# Patient Record
Sex: Female | Born: 1975 | Race: White | Hispanic: No | Marital: Married | Smoking: Former smoker
Health system: Southern US, Community
[De-identification: ages and names within clinical notes are randomized; demographics above are authoritative.]

## PROBLEM LIST (undated history)

## (undated) DIAGNOSIS — T7840XA Allergy, unspecified, initial encounter: Secondary | ICD-10-CM

## (undated) DIAGNOSIS — K5792 Diverticulitis of intestine, part unspecified, without perforation or abscess without bleeding: Secondary | ICD-10-CM

## (undated) DIAGNOSIS — G43909 Migraine, unspecified, not intractable, without status migrainosus: Secondary | ICD-10-CM

## (undated) DIAGNOSIS — U071 COVID-19: Secondary | ICD-10-CM

## (undated) DIAGNOSIS — M1990 Unspecified osteoarthritis, unspecified site: Secondary | ICD-10-CM

## (undated) DIAGNOSIS — F329 Major depressive disorder, single episode, unspecified: Secondary | ICD-10-CM

## (undated) DIAGNOSIS — F32A Depression, unspecified: Secondary | ICD-10-CM

## (undated) HISTORY — DX: Depression, unspecified: F32.A

## (undated) HISTORY — DX: Diverticulitis of intestine, part unspecified, without perforation or abscess without bleeding: K57.92

## (undated) HISTORY — DX: COVID-19: U07.1

## (undated) HISTORY — DX: Allergy, unspecified, initial encounter: T78.40XA

## (undated) HISTORY — DX: Major depressive disorder, single episode, unspecified: F32.9

## (undated) HISTORY — DX: Migraine, unspecified, not intractable, without status migrainosus: G43.909

## (undated) HISTORY — PX: TUBAL LIGATION: SHX77

---

## 2007-09-13 ENCOUNTER — Ambulatory Visit: Payer: Self-pay | Admitting: Family Medicine

## 2009-05-22 ENCOUNTER — Ambulatory Visit: Payer: Self-pay | Admitting: Internal Medicine

## 2012-01-14 ENCOUNTER — Emergency Department: Payer: Self-pay | Admitting: Internal Medicine

## 2016-08-27 DIAGNOSIS — M25561 Pain in right knee: Secondary | ICD-10-CM | POA: Diagnosis not present

## 2016-08-27 DIAGNOSIS — M1711 Unilateral primary osteoarthritis, right knee: Secondary | ICD-10-CM | POA: Diagnosis not present

## 2016-08-27 DIAGNOSIS — M25461 Effusion, right knee: Secondary | ICD-10-CM | POA: Diagnosis not present

## 2016-09-15 DIAGNOSIS — M1711 Unilateral primary osteoarthritis, right knee: Secondary | ICD-10-CM | POA: Diagnosis not present

## 2016-09-15 DIAGNOSIS — M25561 Pain in right knee: Secondary | ICD-10-CM | POA: Diagnosis not present

## 2016-09-15 DIAGNOSIS — M25461 Effusion, right knee: Secondary | ICD-10-CM | POA: Diagnosis not present

## 2016-09-16 DIAGNOSIS — M1711 Unilateral primary osteoarthritis, right knee: Secondary | ICD-10-CM | POA: Diagnosis not present

## 2016-10-07 DIAGNOSIS — M25561 Pain in right knee: Secondary | ICD-10-CM | POA: Diagnosis not present

## 2016-10-13 DIAGNOSIS — M25561 Pain in right knee: Secondary | ICD-10-CM | POA: Diagnosis not present

## 2016-10-16 DIAGNOSIS — M25561 Pain in right knee: Secondary | ICD-10-CM | POA: Diagnosis not present

## 2016-10-22 DIAGNOSIS — M25561 Pain in right knee: Secondary | ICD-10-CM | POA: Diagnosis not present

## 2016-10-29 DIAGNOSIS — M25561 Pain in right knee: Secondary | ICD-10-CM | POA: Diagnosis not present

## 2016-10-31 DIAGNOSIS — M25561 Pain in right knee: Secondary | ICD-10-CM | POA: Diagnosis not present

## 2016-11-03 DIAGNOSIS — M25561 Pain in right knee: Secondary | ICD-10-CM | POA: Diagnosis not present

## 2016-11-05 DIAGNOSIS — M25561 Pain in right knee: Secondary | ICD-10-CM | POA: Diagnosis not present

## 2016-11-10 DIAGNOSIS — M25561 Pain in right knee: Secondary | ICD-10-CM | POA: Diagnosis not present

## 2016-11-12 DIAGNOSIS — M25561 Pain in right knee: Secondary | ICD-10-CM | POA: Diagnosis not present

## 2016-11-17 DIAGNOSIS — M25561 Pain in right knee: Secondary | ICD-10-CM | POA: Diagnosis not present

## 2016-11-21 DIAGNOSIS — M25561 Pain in right knee: Secondary | ICD-10-CM | POA: Diagnosis not present

## 2016-11-24 DIAGNOSIS — M25561 Pain in right knee: Secondary | ICD-10-CM | POA: Diagnosis not present

## 2016-11-26 DIAGNOSIS — M25561 Pain in right knee: Secondary | ICD-10-CM | POA: Diagnosis not present

## 2016-12-01 DIAGNOSIS — M25561 Pain in right knee: Secondary | ICD-10-CM | POA: Diagnosis not present

## 2016-12-03 DIAGNOSIS — M25561 Pain in right knee: Secondary | ICD-10-CM | POA: Diagnosis not present

## 2016-12-08 DIAGNOSIS — M25561 Pain in right knee: Secondary | ICD-10-CM | POA: Diagnosis not present

## 2016-12-10 DIAGNOSIS — M25561 Pain in right knee: Secondary | ICD-10-CM | POA: Diagnosis not present

## 2016-12-17 DIAGNOSIS — M25561 Pain in right knee: Secondary | ICD-10-CM | POA: Diagnosis not present

## 2016-12-23 DIAGNOSIS — M25561 Pain in right knee: Secondary | ICD-10-CM | POA: Diagnosis not present

## 2017-07-20 ENCOUNTER — Other Ambulatory Visit: Payer: Self-pay

## 2017-07-20 ENCOUNTER — Emergency Department
Admission: EM | Admit: 2017-07-20 | Discharge: 2017-07-21 | Disposition: A | Discharge status: Home / Self Care | Payer: Commercial Managed Care - HMO | Attending: Emergency Medicine | Admitting: Emergency Medicine

## 2017-07-20 ENCOUNTER — Emergency Department: Payer: Commercial Managed Care - HMO

## 2017-07-20 DIAGNOSIS — K5732 Diverticulitis of large intestine without perforation or abscess without bleeding: Secondary | ICD-10-CM | POA: Insufficient documentation

## 2017-07-20 DIAGNOSIS — R1032 Left lower quadrant pain: Secondary | ICD-10-CM

## 2017-07-20 HISTORY — DX: Unspecified osteoarthritis, unspecified site: M19.90

## 2017-07-20 LAB — COMPREHENSIVE METABOLIC PANEL
ALBUMIN: 4.2 g/dL (ref 3.5–5.0)
ALT: 17 U/L (ref 14–54)
AST: 22 U/L (ref 15–41)
Alkaline Phosphatase: 77 U/L (ref 38–126)
Anion gap: 11 (ref 5–15)
BUN: 13 mg/dL (ref 6–20)
CHLORIDE: 102 mmol/L (ref 101–111)
CO2: 25 mmol/L (ref 22–32)
Calcium: 9.4 mg/dL (ref 8.9–10.3)
Creatinine, Ser: 0.85 mg/dL (ref 0.44–1.00)
GFR calc Af Amer: >60 mL/min (ref >60)
GFR calc non Af Amer: >60 mL/min (ref >60)
GLUCOSE: 96 mg/dL (ref 65–99)
POTASSIUM: 4.1 mmol/L (ref 3.5–5.1)
Sodium: 138 mmol/L (ref 135–145)
Total Bilirubin: 0.4 mg/dL (ref 0.3–1.2)
Total Protein: 8.1 g/dL (ref 6.5–8.1)

## 2017-07-20 LAB — URINALYSIS, COMPLETE (UACMP) WITH MICROSCOPIC
BACTERIA UA: NONE SEEN
BILIRUBIN URINE: NEGATIVE
Glucose, UA: NEGATIVE mg/dL
Hgb urine dipstick: NEGATIVE
Ketones, ur: NEGATIVE mg/dL
LEUKOCYTES UA: NEGATIVE
Nitrite: NEGATIVE
Protein, ur: NEGATIVE mg/dL
SPECIFIC GRAVITY, URINE: 1.006 (ref 1.005–1.030)
pH: 6 (ref 5.0–8.0)

## 2017-07-20 LAB — CBC
HEMATOCRIT: 38.4 % (ref 35.0–47.0)
Hemoglobin: 12.8 g/dL (ref 12.0–16.0)
MCH: 27.3 pg (ref 26.0–34.0)
MCHC: 33.4 g/dL (ref 32.0–36.0)
MCV: 81.6 fL (ref 80.0–100.0)
Platelets: 369 10*3/uL (ref 150–440)
RBC: 4.7 MIL/uL (ref 3.80–5.20)
RDW: 14.2 % (ref 11.5–14.5)
WBC: 9.3 10*3/uL (ref 3.6–11.0)

## 2017-07-20 LAB — LIPASE, BLOOD: LIPASE: 29 U/L (ref 11–51)

## 2017-07-20 LAB — PREGNANCY, URINE: PREG TEST UR: NEGATIVE

## 2017-07-20 MED ORDER — ONDANSETRON 4 MG PO TBDP: 4.0000 mg | ORAL_TABLET | Freq: Three times a day (TID) | ORAL | 0 refills | Status: DC | PRN

## 2017-07-20 MED ORDER — METRONIDAZOLE 500 MG PO TABS: 500.0000 mg | ORAL_TABLET | Freq: Three times a day (TID) | ORAL | 0 refills | Status: DC

## 2017-07-20 MED ORDER — CIPROFLOXACIN HCL 500 MG PO TABS: 500.0000 mg | ORAL_TABLET | Freq: Two times a day (BID) | ORAL | 0 refills | Status: DC

## 2017-07-20 NOTE — ED Notes (Signed)
LLQ pain since Christmas reports dull most of the time at times stabbing pain tender to touch. Pt talks in complete sentences no distress noted

## 2017-07-20 NOTE — ED Provider Notes (Signed)
[facility] Emergency Department Provider Note  ____________________________________________  Time seen: Approximately 11:34 PM  I have reviewed the triage vital signs and the nursing notes.   HISTORY  Chief Complaint Abdominal Pain    HPI Peggy Rogers is a 42 y.o. female who complains of left lower quadrant abdominal pain which started after she had the flu in the few days preceding Christmas. As all her other flulike symptoms resolve, she had persistent left lower quadrant abdominal pain, worse with certain movements, tender to the touch. Also feels like there is fullness in that area when she stretches. Denies any trauma. Pain is worse with crouching motion when getting in and out of a car. No alleviating factors. Nonradiating. Moderate intensity, dull.     Past Medical History:  Diagnosis Date  . Arthritis      There are no active problems to display for this patient.    Past Surgical History:  Procedure Laterality Date  . TUBAL LIGATION       Prior to Admission medications   Medication Sig Start Date End Date Taking? Authorizing Provider  ciprofloxacin (CIPRO) 500 MG tablet Take 1 tablet (500 mg total) by mouth 2 (two) times daily. 07/20/17   Sharman CheekStafford, Ashlyne Olenick, MD  metroNIDAZOLE (FLAGYL) 500 MG tablet Take 1 tablet (500 mg total) by mouth 3 (three) times daily. 07/20/17   Sharman CheekStafford, Glenroy Crossen, MD  ondansetron (ZOFRAN ODT) 4 MG disintegrating tablet Take 1 tablet (4 mg total) by mouth every 8 (eight) hours as needed for nausea or vomiting. 07/20/17   Sharman CheekStafford, Celese Banner, MD  None   Allergies Penicillins   No family history on file.  Social History Social History   Tobacco Use  . Smoking status: Not on file  Substance Use Topics  . Alcohol use: Not on file  . Drug use: Not on file  No alcohol or drug use  Review of Systems  Constitutional:   No fever or chills.  ENT:   No sore throat. No rhinorrhea. Cardiovascular:   No chest  pain or syncope. Respiratory:   No dyspnea or cough. Gastrointestinal:   Positive as above for abdominal pain without recent vomiting and diarrhea.  Musculoskeletal:   Negative for focal pain or swelling All other systems reviewed and are negative except as documented above in ROS and HPI.  ____________________________________________   PHYSICAL EXAM:  VITAL SIGNS: ED Triage Vitals [07/20/17 1936]  Enc Vitals Group     BP (!) 151/96     Pulse Rate 86     Resp 17     Temp 98.1 F (36.7 C)     Temp Source Oral     SpO2 100 %     Weight 231 lb (104.8 kg)     Height 5' 3.5" (1.613 m)     Head Circumference      Peak Flow      Pain Score 3     Pain Loc      Pain Edu?      Excl. in GC?     Vital signs reviewed, nursing assessments reviewed.   Constitutional:   Alert and oriented. Well appearing and in no distress. Eyes:   No scleral icterus.  EOMI. No nystagmus. No conjunctival pallor. PERRL. ENT   Head:   Normocephalic and atraumatic.   Nose:   No congestion/rhinnorhea.    Mouth/Throat:   MMM, no pharyngeal erythema. No peritonsillar mass.    Neck:   No meningismus. Full ROM. Hematological/Lymphatic/Immunilogical:  No cervical lymphadenopathy. Cardiovascular:   RRR. Symmetric bilateral radial and DP pulses.  No murmurs.  Respiratory:   Normal respiratory effort without tachypnea/retractions. Breath sounds are clear and equal bilaterally. No wheezes/rales/rhonchi. Gastrointestinal:   Soft with left lower quadrant tenderness and slight fullness to palpation.. Non distended. There is no CVA tenderness.  No rebound, rigidity, or guarding. Genitourinary:   deferred Musculoskeletal:   Normal range of motion in all extremities. No joint effusions.  No lower extremity tenderness.  No edema. Neurologic:   Normal speech and language.  Motor grossly intact. No acute focal neurologic deficits are appreciated.  Skin:    Skin is warm, dry and intact. No rash noted.  No  petechiae, purpura, or bullae.  ____________________________________________    LABS (pertinent positives/negatives) (all labs ordered are listed, but only abnormal results are displayed) Labs Reviewed  URINALYSIS, COMPLETE (UACMP) WITH MICROSCOPIC - Abnormal; Notable for the following components:      Result Value   Color, Urine YELLOW (*)    APPearance CLEAR (*)    Squamous Epithelial / LPF 0-5 (*)    All other components within normal limits  LIPASE, BLOOD  COMPREHENSIVE METABOLIC PANEL  CBC  PREGNANCY, URINE   ____________________________________________   EKG    ____________________________________________    RADIOLOGY  Ct Renal Stone Study  Result Date: 07/20/2017 CLINICAL DATA:  42 year old female with left lower quadrant abdominal pain. EXAM: CT ABDOMEN AND PELVIS WITHOUT CONTRAST TECHNIQUE: Multidetector CT imaging of the abdomen and pelvis was performed following the standard protocol without IV contrast. COMPARISON:  Abdominal radiograph dated 09/13/2007. FINDINGS: Evaluation of this exam is limited in the absence of intravenous contrast. Lower chest: The visualized lung bases are clear. No intra-abdominal free air or free fluid. Hepatobiliary: No focal liver abnormality is seen. No gallstones, gallbladder wall thickening, or biliary dilatation. Pancreas: Unremarkable. No pancreatic ductal dilatation or surrounding inflammatory changes. Spleen: Normal in size without focal abnormality. Adrenals/Urinary Tract: Adrenal glands are unremarkable. Kidneys are normal, without renal calculi, focal lesion, or hydronephrosis. Bladder is unremarkable. Stomach/Bowel: There are scattered sigmoid diverticula. There is mild haziness and inflammatory changes surrounding a sigmoid diverticula in the left hemipelvis (series 2, image 56) consistent with acute diverticulitis. No diverticular abscess or perforation. There is no bowel obstruction. Normal appendix. Vascular/Lymphatic: The  abdominal aorta and IVC are grossly unremarkable on this noncontrast CT. No portal venous gas there is no adenopathy. Reproductive: The uterus is anteverted and grossly unremarkable. The ovaries appear unremarkable as well. Bilateral tubal ligation clips noted. Other: None Musculoskeletal: Degenerative changes of the lower thoracic spine. Disc desiccation at L5-S1. No acute osseous pathology. IMPRESSION: 1. Mild sigmoid diverticulitis. No diverticular abscess or perforation. 2. No hydronephrosis or nephrolithiasis. Electronically Signed   By: Elgie Collard M.D.   On: 07/20/2017 23:47    ____________________________________________   PROCEDURES Procedures  ____________________________________________  DIFFERENTIAL DIAGNOSIS Pelvic mass, diverticulitis, ovarian cyst  CLINICAL IMPRESSION / ASSESSMENT AND PLAN / ED COURSE  Pertinent labs & imaging results that were available during my care of the patient were reviewed by me and considered in my medical decision making (see chart for details).   Patient well-appearing no acute distress, unremarkable vital signs, presents with persistent left lower quadrant pain for the past 2 or more weeks. Tender on exam. Other vague symptoms as well necessitating imaging today to screen for malignancy versus infectious process.       ----------------------------------------- 11:53 PM on 07/20/2017 -----------------------------------------  CT reveals sigmoid diverticulitis. Given the  chronicity of symptoms, I'll start her on Cipro and Flagyl. Zofran when necessary. Follow-up with primary care. ____________________________________________   FINAL CLINICAL IMPRESSION(S) / ED DIAGNOSES    Final diagnoses:  LLQ pain  Sigmoid diverticulitis       Portions of this note were generated with dragon dictation software. Dictation errors may occur despite best attempts at proofreading.    Sharman Cheek, MD 07/20/17 [phone]

## 2017-07-20 NOTE — ED Triage Notes (Signed)
Patient c/o LLQ abdominal pain beginning around 12/25 with increasing severity last night. Patient reports she initially (12/25) had N/V/D, which has resolved.

## 2017-07-27 ENCOUNTER — Telehealth: Payer: Self-pay

## 2017-09-01 ENCOUNTER — Ambulatory Visit: Payer: BLUE CROSS/BLUE SHIELD | Admitting: Internal Medicine

## 2017-09-01 VITALS — BP 124/78 | HR 81 | Temp 97.9°F | Ht 63.5 in | Wt 225.8 lb

## 2017-09-01 DIAGNOSIS — Z113 Encounter for screening for infections with a predominantly sexual mode of transmission: Secondary | ICD-10-CM | POA: Diagnosis not present

## 2017-09-01 DIAGNOSIS — Z1231 Encounter for screening mammogram for malignant neoplasm of breast: Secondary | ICD-10-CM | POA: Diagnosis not present

## 2017-09-01 DIAGNOSIS — E559 Vitamin D deficiency, unspecified: Secondary | ICD-10-CM

## 2017-09-01 DIAGNOSIS — Z13220 Encounter for screening for lipoid disorders: Secondary | ICD-10-CM | POA: Diagnosis not present

## 2017-09-01 DIAGNOSIS — Z1329 Encounter for screening for other suspected endocrine disorder: Secondary | ICD-10-CM

## 2017-09-01 DIAGNOSIS — K5792 Diverticulitis of intestine, part unspecified, without perforation or abscess without bleeding: Secondary | ICD-10-CM | POA: Diagnosis not present

## 2017-09-01 NOTE — Patient Instructions (Addendum)
[facility] [place]  Will refer for mammo  Please sch pap at f/u and have labs before next visit fasting labs no food x 12 hours only water  Diverticulitis Diverticulitis is infection or inflammation of small pouches (diverticula) in the colon that form due to a condition called diverticulosis. Diverticula can trap stool (feces) and bacteria, causing infection and inflammation. Diverticulitis may cause severe stomach pain and diarrhea. It may lead to tissue damage in the colon that causes bleeding. The diverticula may also burst (rupture) and cause infected stool to enter other areas of the abdomen. Complications of diverticulitis can include:  Bleeding.  Severe infection.  Severe pain.  Rupture (perforation) of the colon.  Blockage (obstruction) of the colon.  What are the causes? This condition is caused by stool becoming trapped in the diverticula, which allows bacteria to grow in the diverticula. This leads to inflammation and infection. What increases the risk? You are more likely to develop this condition if:  You have diverticulosis. The risk for diverticulosis increases if: ? You are overweight or obese. ? You use tobacco products. ? You do not get enough exercise.  You eat a diet that does not include enough fiber. High-fiber foods include fruits, vegetables, beans, nuts, and whole grains.  What are the signs or symptoms? Symptoms of this condition may include:  Pain and tenderness in the abdomen. The pain is normally located on the left side of the abdomen, but it may occur in other areas.  Fever and chills.  Bloating.  Cramping.  Nausea.  Vomiting.  Changes in bowel routines.  Blood in your stool.  How is this diagnosed? This condition is diagnosed based on:  Your medical history.  A physical exam.  Tests to make sure there is nothing else causing your condition. These tests may include: ? Blood tests. ? Urine  tests. ? Imaging tests of the abdomen, including X-rays, ultrasounds, MRIs, or CT scans.  How is this treated? Most cases of this condition are mild and can be treated at home. Treatment may include:  Taking over-the-counter pain medicines.  Following a clear liquid diet.  Taking antibiotic medicines by mouth.  Rest.  More severe cases may need to be treated at a hospital. Treatment may include:  Not eating or drinking.  Taking prescription pain medicine.  Receiving antibiotic medicines through an IV tube.  Receiving fluids and nutrition through an IV tube.  Surgery.  When your condition is under control, your health care provider may recommend that you have a colonoscopy. This is an exam to look at the entire large intestine. During the exam, a lubricated, bendable tube is inserted into the anus and then passed into the rectum, colon, and other parts of the large intestine. A colonoscopy can show how severe your diverticula are and whether something else may be causing your symptoms. Follow these instructions at home: Medicines  Take over-the-counter and prescription medicines only as told by your health care provider. These include fiber supplements, probiotics, and stool softeners.  If you were prescribed an antibiotic medicine, take it as told by your health care provider. Do not stop taking the antibiotic even if you start to feel better.  Do not drive or use heavy machinery while taking prescription pain medicine. General instructions  Follow a full liquid diet or another diet as directed by your health care provider. After your symptoms improve, your health care provider may tell you to change your diet.  He or she may recommend that you eat a diet that contains at least 25 g (25 grams) of fiber daily. Fiber makes it easier to pass stool. Healthy sources of fiber include: ? Berries. One cup contains 4-8 grams of fiber. ? Beans or lentils. One half cup contains 5-8 grams  of fiber. ? Green vegetables. One cup contains 4 grams of fiber.  Exercise for at least 30 minutes, 3 times each week. You should exercise hard enough to raise your heart rate and break a sweat.  Keep all follow-up visits as told by your health care provider. This is important. You may need a colonoscopy. Contact a health care provider if:  Your pain does not improve.  You have a hard time drinking or eating food.  Your bowel movements do not return to normal. Get help right away if:  Your pain gets worse.  Your symptoms do not get better with treatment.  Your symptoms suddenly get worse.  You have a fever.  You vomit more than one time.  You have stools that are bloody, black, or tarry. Summary  Diverticulitis is infection or inflammation of small pouches (diverticula) in the colon that form due to a condition called diverticulosis. Diverticula can trap stool (feces) and bacteria, causing infection and inflammation.  You are at higher risk for this condition if you have diverticulosis and you eat a diet that does not include enough fiber.  Most cases of this condition are mild and can be treated at home. More severe cases may need to be treated at a hospital.  When your condition is under control, your health care provider may recommend that you have an exam called a colonoscopy. This exam can show how severe your diverticula are and whether something else may be causing your symptoms. This information is not intended to replace advice given to you by your health care provider. Make sure you discuss any questions you have with your health care provider. Document Released: 04/02/2005 Document Revised: 07/26/2016 Document Reviewed: 07/26/2016 Elsevier Interactive Patient Education  [employer].

## 2017-09-01 NOTE — Progress Notes (Signed)
Pre visit review using our clinic review tool, if applicable. No additional management support is needed unless otherwise documented below in the visit note. 

## 2017-09-06 ENCOUNTER — Encounter: Payer: Self-pay | Admitting: Internal Medicine

## 2017-09-06 DIAGNOSIS — K5792 Diverticulitis of intestine, part unspecified, without perforation or abscess without bleeding: Secondary | ICD-10-CM | POA: Insufficient documentation

## 2017-09-06 NOTE — Progress Notes (Signed)
Chief Complaint  Patient presents with  . New Patient (Initial Visit)   New patient moving to [place] in 6 months  1. H/o ED visit diverticulitis resolved abdominal pian no black or red stools took cipro/Flagyl given in the ED x 7-10 days. She also had change in stool consistency. CT reviewed 07/20/17 mild sigmoid diverticulitis     Review of Systems  Constitutional: Negative for weight loss.  HENT: Negative for hearing loss.   Eyes:       +sees spots at times   Respiratory: Negative for shortness of breath.   Cardiovascular: Negative for chest pain.  Gastrointestinal: Negative for abdominal pain and blood in stool.  Musculoskeletal: Positive for joint pain.  Skin: Negative for rash.  Neurological: Negative for headaches.  Psychiatric/Behavioral: Negative for depression and memory loss. The patient is not nervous/anxious.        +stress reduced since quitting old job   Past Medical History:  Diagnosis Date  . Arthritis    Past Surgical History:  Procedure Laterality Date  . TUBAL LIGATION     History reviewed. No pertinent family history. Social History   Socioeconomic History  . Marital status: Married    Spouse name: Not on file  . Number of children: Not on file  . Years of education: Not on file  . Highest education level: Not on file  Social Needs  . Financial resource strain: Not on file  . Food insecurity - worry: Not on file  . Food insecurity - inability: Not on file  . Transportation needs - medical: Not on file  . Transportation needs - non-medical: Not on file  Occupational History  . Not on file  Tobacco Use  . Smoking status: Former Research scientist (life sciences)  . Smokeless tobacco: Never Used  . Tobacco comment: former smoker quit 1 week ago 08/2017 since age 42 y.o   Substance and Sexual Activity  . Alcohol use: Yes  . Drug use: No  . Sexual activity: Yes    Partners: Female  Other Topics Concern  . Not on file  Social History Narrative  . Not on file   No  outpatient medications have been marked as taking for the 09/01/17 encounter (Office Visit) with McLean-Scocuzza, Nino Glow, MD.   Allergies  Allergen Reactions  . Penicillins Rash   Recent Results (from the past 2160 hour(s))  Lipase, blood     Status: None   Collection Time: 07/20/17  7:33 PM  Result Value Ref Range   Lipase 29 11 - 51 U/L    Comment: Performed at [facility], [address] [place], [place] [address]  Comprehensive metabolic panel     Status: None   Collection Time: 07/20/17  7:33 PM  Result Value Ref Range   Sodium 138 135 - 145 mmol/L   Potassium 4.1 3.5 - 5.1 mmol/L   Chloride 102 101 - 111 mmol/L   CO2 25 22 - 32 mmol/L   Glucose, Bld 96 65 - 99 mg/dL   BUN 13 6 - 20 mg/dL   Creatinine, Ser 0.85 0.44 - 1.00 mg/dL   Calcium 9.4 8.9 - 10.3 mg/dL   Total Protein 8.1 6.5 - 8.1 g/dL   Albumin 4.2 3.5 - 5.0 g/dL   AST 22 15 - 41 U/L   ALT 17 14 - 54 U/L   Alkaline Phosphatase 77 38 - 126 U/L   Total Bilirubin 0.4 0.3 - 1.2 mg/dL   GFR calc non Af Amer >60 >60  mL/min   GFR calc Af Amer >60 >60 mL/min    Comment: (NOTE) The eGFR has been calculated using the CKD EPI equation. This calculation has not been validated in all clinical situations. eGFR's persistently <60 mL/min signify possible Chronic Kidney Disease.    Anion gap 11 5 - 15    Comment: Performed at [facility], [address] [place], [place] [address]  CBC     Status: None   Collection Time: 07/20/17  7:33 PM  Result Value Ref Range   WBC 9.3 3.6 - 11.0 K/uL   RBC 4.70 3.80 - 5.20 MIL/uL   Hemoglobin 12.8 12.0 - 16.0 g/dL   HCT 38.4 35.0 - 47.0 %   MCV 81.6 80.0 - 100.0 fL   MCH 27.3 26.0 - 34.0 pg   MCHC 33.4 32.0 - 36.0 g/dL   RDW 14.2 11.5 - 14.5 %   Platelets 369 150 - 440 K/uL    Comment: Performed at [facility], [address] [place], [place] [address]  Urinalysis, Complete w Microscopic     Status: Abnormal   Collection Time: 07/20/17  7:33 PM   Result Value Ref Range   Color, Urine YELLOW (A) YELLOW   APPearance CLEAR (A) CLEAR   Specific Gravity, Urine 1.006 1.005 - 1.030   pH 6.0 5.0 - 8.0   Glucose, UA NEGATIVE NEGATIVE mg/dL   Hgb urine dipstick NEGATIVE NEGATIVE   Bilirubin Urine NEGATIVE NEGATIVE   Ketones, ur NEGATIVE NEGATIVE mg/dL   Protein, ur NEGATIVE NEGATIVE mg/dL   Nitrite NEGATIVE NEGATIVE   Leukocytes, UA NEGATIVE NEGATIVE   RBC / HPF 0-5 0 - 5 RBC/hpf   WBC, UA 0-5 0 - 5 WBC/hpf   Bacteria, UA NONE SEEN NONE SEEN   Squamous Epithelial / LPF 0-5 (A) NONE SEEN   Mucus PRESENT     Comment: Performed at [facility], [address] [place], [place] [address]  Pregnancy, urine     Status: None   Collection Time: 07/20/17  7:33 PM  Result Value Ref Range   Preg Test, Ur NEGATIVE NEGATIVE    Comment: Performed at [facility], [address] [place], [place] [address]   Objective  Body mass index is 39.37 kg/m. Wt Readings from Last 3 Encounters:  09/01/17 225 lb 12.8 oz (102.4 kg)  07/20/17 231 lb (104.8 kg)   Temp Readings from Last 3 Encounters:  09/01/17 97.9 F (36.6 C) (Oral)  07/20/17 98 F (36.7 C) (Oral)   BP Readings from Last 3 Encounters:  09/01/17 124/78  07/20/17 (!) 144/93   Pulse Readings from Last 3 Encounters:  09/01/17 81  07/20/17 84   O2 sat room air 97%  Physical Exam  Constitutional: She is oriented to person, place, and time and well-developed, well-nourished, and in no distress. Vital signs are normal.  HENT:  Head: Normocephalic and atraumatic.  Mouth/Throat: Oropharynx is clear and moist and mucous membranes are normal.  Eyes: Conjunctivae are normal. Pupils are equal, round, and reactive to light.  Cardiovascular: Normal rate, regular rhythm and normal heart sounds.  Pulmonary/Chest: Effort normal and breath sounds normal.  Abdominal: Soft. Bowel sounds are normal. There is no tenderness.  Neurological: She is alert and oriented to person,  place, and time. Gait normal. Gait normal.  Skin: Skin is warm, dry and intact.  Psychiatric: Mood, memory, affect and judgment normal.  Nursing note and vitals reviewed.   Assessment   1. Diverticulitis 07/20/17  2. HM  Plan   1. Refer to GI Dr. Rob Bunting  2.  Declines flu shot  Tdap had 2012/2013  Had CMET, CBC, UA, check other labs lipid, TSH, T4, STD check, vitamin d   Refer for mammogram 3d in [place]  Pap at f/u and labs before next visit fasting LMP 07/08/17  Referred to GI see #1 for h/o diverticulitis and likely need for colonoscopy  Last eye exam 2018   Also sees [facility] in [place] Dr. Knox Saliva since middle school ?macular degeneration mom has spots too  Dentist Dr. Johnnette Litter  Provider: Dr. Olivia Mackie McLean-Scocuzza-Internal Medicine

## 2017-09-08 ENCOUNTER — Other Ambulatory Visit (INDEPENDENT_AMBULATORY_CARE_PROVIDER_SITE_OTHER): Payer: BLUE CROSS/BLUE SHIELD

## 2017-09-08 DIAGNOSIS — E559 Vitamin D deficiency, unspecified: Secondary | ICD-10-CM

## 2017-09-08 DIAGNOSIS — Z13220 Encounter for screening for lipoid disorders: Secondary | ICD-10-CM | POA: Diagnosis not present

## 2017-09-08 DIAGNOSIS — Z113 Encounter for screening for infections with a predominantly sexual mode of transmission: Secondary | ICD-10-CM

## 2017-09-08 DIAGNOSIS — Z1329 Encounter for screening for other suspected endocrine disorder: Secondary | ICD-10-CM

## 2017-09-08 LAB — LIPID PANEL
CHOLESTEROL: 175 mg/dL (ref 0–200)
HDL: 55.4 mg/dL (ref >39.00)
LDL CALC: 107 mg/dL — AB (ref 0–99)
NonHDL: 119.91
TRIGLYCERIDES: 64 mg/dL (ref 0.0–149.0)
Total CHOL/HDL Ratio: 3
VLDL: 12.8 mg/dL (ref 0.0–40.0)

## 2017-09-08 LAB — TSH: TSH: 2.23 u[IU]/mL (ref 0.35–4.50)

## 2017-09-08 LAB — T4, FREE: Free T4: 0.76 ng/dL (ref 0.60–1.60)

## 2017-09-08 LAB — VITAMIN D 25 HYDROXY (VIT D DEFICIENCY, FRACTURES): VITD: 14.12 ng/mL — ABNORMAL LOW (ref 30.00–100.00)

## 2017-09-09 LAB — HEPATITIS B SURFACE ANTIBODY, QUANTITATIVE: Hepatitis B-Post: <5 m[IU]/mL — ABNORMAL LOW (ref >=10)

## 2017-09-09 LAB — HEPATITIS B SURFACE ANTIGEN: Hepatitis B Surface Ag: NONREACTIVE

## 2017-09-09 LAB — HIV ANTIBODY (ROUTINE TESTING W REFLEX): HIV 1&2 Ab, 4th Generation: NONREACTIVE

## 2017-09-09 LAB — HSV 2 ANTIBODY, IGG

## 2017-09-09 LAB — HSV 1 ANTIBODY, IGG: HSV 1 GLYCOPROTEIN G AB, IGG: 49.2 {index} — AB

## 2017-09-09 LAB — RPR: RPR: NONREACTIVE

## 2017-09-09 LAB — HEPATITIS C ANTIBODY
Hepatitis C Ab: NONREACTIVE
SIGNAL TO CUT-OFF: 0.02 (ref <1.00)

## 2017-09-16 ENCOUNTER — Encounter: Payer: Self-pay | Admitting: Gastroenterology

## 2017-09-16 ENCOUNTER — Other Ambulatory Visit: Payer: Self-pay | Admitting: Internal Medicine

## 2017-09-16 ENCOUNTER — Ambulatory Visit: Payer: BLUE CROSS/BLUE SHIELD | Admitting: Gastroenterology

## 2017-09-16 VITALS — BP 121/85 | HR 64 | Ht 63.5 in | Wt 220.4 lb

## 2017-09-16 DIAGNOSIS — K5732 Diverticulitis of large intestine without perforation or abscess without bleeding: Secondary | ICD-10-CM | POA: Diagnosis not present

## 2017-09-16 DIAGNOSIS — E559 Vitamin D deficiency, unspecified: Secondary | ICD-10-CM

## 2017-09-16 MED ORDER — PEG 3350-KCL-NA BICARB-NACL 420 G PO SOLR: 4000.0000 mL | Freq: Once | ORAL | 0 refills | Status: AC

## 2017-09-16 MED ORDER — CHOLECALCIFEROL 1.25 MG (50000 UT) PO CAPS: 50000.0000 [IU] | ORAL_CAPSULE | ORAL | 1 refills | Status: DC

## 2017-09-16 NOTE — Addendum Note (Signed)
Addended by: Ardyth ManARTER, Hanne Kegg Z on: 09/16/2017 12:38 PM   Modules accepted: Orders, SmartSet

## 2017-09-16 NOTE — Progress Notes (Signed)
Wyline MoodKiran Brantlee Hinde MD, MRCP(U.K) [address]  [address]  [place], [place] [address]  Main: [phone]  Fax: [phone]   Gastroenterology Consultation  Referring Provider:     McLean-Scocuzza, French Anaracy * Primary Care Physician:  McLean-Scocuzza, Pasty Spillersracy N, MD Primary Gastroenterologist:  Dr. Wyline MoodKiran Paula Busenbark  Reason for Consultation:     Diverticulitis         HPI:   Peggy Rogers is a 42 y.o. y/o female referred for consultation & management  by Dr. Judie GrieveMcLean-Scocuzza, Pasty Spillersracy N, MD.   She has been referred for diverticulitis. Seen at the ER on 07/20/17 for abdominal pain. CT scan of the abdomen showed mild sigmoid diverticulitis. HB 12.8 grams .That was her first episode. After the antibiotics her symptoms have resolved.   She is scared to eat certain foods. No abdominal pain presently. No rectal bleeding. Some weight loss, she attributes that to a recent flu illness and recent break up and diverticulitis. No colon cancer in the family . Father was a smoker and had esophageal cancer. Mother and grandmother had skin cancer. grandad had leukemia.   Her stool is "not my normal", change in shape and consistency.   Past Medical History:  Diagnosis Date  . Allergy   . Arthritis    right>left knee pt helped and dry needling   . Arthritis    right index PIP nodule   . Depression   . Diverticulitis   . Migraine     Past Surgical History:  Procedure Laterality Date  . TUBAL LIGATION     02/1997    Prior to Admission medications   Not on File    Family History  Problem Relation Age of Onset  . Cancer Mother        skin  . Hearing loss Mother   . Heart disease Mother   . Hypertension Mother   . Cancer Father        esophageal smoker   . Early death Father   . Cancer Maternal Grandmother        skin  . Diabetes Maternal Grandfather   . Heart disease Maternal Grandfather   . Hypertension Maternal Grandfather   . Cancer Paternal Grandmother        skin  . Arthritis Paternal  Grandfather   . Cancer Paternal Grandfather        leukemia  . Hearing loss Paternal Grandfather      Social History   Tobacco Use  . Smoking status: Former Games developermoker  . Smokeless tobacco: Never Used  . Tobacco comment: former smoker quit 1 week ago 08/2017 since age 42 y.o   Substance Use Topics  . Alcohol use: Yes  . Drug use: No    Allergies as of 09/16/2017 - Review Complete 09/01/2017  Allergen Reaction Noted  . Penicillins Rash 07/20/2017    Review of Systems:    All systems reviewed and negative except where noted in HPI.   Physical Exam:  There were no vitals taken for this visit. No LMP recorded. Patient is not currently having periods (Reason: Other). Psych:  Alert and cooperative. Normal mood and affect. General:   Alert,  Well-developed, well-nourished, pleasant and cooperative in NAD Head:  Normocephalic and atraumatic. Eyes:  Sclera clear, no icterus.   Conjunctiva pink. Ears:  Normal auditory acuity. Nose:  No deformity, discharge, or lesions. Mouth:  No deformity or lesions,oropharynx pink & moist. Neck:  Supple; no masses or thyromegaly. Lungs:  Respirations even and unlabored.  Clear throughout to auscultation.   No wheezes, crackles, or rhonchi. No acute distress. Heart:  Regular rate and rhythm; no murmurs, clicks, rubs, or gallops. Abdomen:  Normal bowel sounds.  No bruits.  Soft, non-tender and non-distended without masses, hepatosplenomegaly or hernias noted.  No guarding or rebound tenderness.    Neurologic:  Alert and oriented x3;  grossly normal neurologically. Skin:  Intact without significant lesions or rashes. No jaundice. Lymph Nodes:  No significant cervical adenopathy. Psych:  Alert and cooperative. Normal mood and affect.  Imaging Studies: No results found.  Assessment and Plan:   [name] is a 42 y.o. y/o female has been referred for first episode of sigmoid diverticulitis. Explained would need a colonoscopy to r/o any neoplasm as  a precipitating factor.     I have discussed alternative options, risks & benefits,  which include, but are not limited to, bleeding, infection, perforation,respiratory complication & drug reaction.  The patient agrees with this plan & written consent will be obtained.     Follow up PRN  Dr Wyline Mood MD,MRCP(U.K)

## 2017-09-22 ENCOUNTER — Encounter: Payer: Self-pay | Admitting: Emergency Medicine

## 2017-09-23 ENCOUNTER — Encounter: Payer: Self-pay | Admitting: *Deleted

## 2017-09-23 ENCOUNTER — Encounter
Admission: RE | Disposition: A | Discharge status: Home / Self Care | Payer: Self-pay | Source: Ambulatory Visit | Attending: Gastroenterology

## 2017-09-23 ENCOUNTER — Ambulatory Visit: Payer: BLUE CROSS/BLUE SHIELD | Admitting: Certified Registered Nurse Anesthetist

## 2017-09-23 ENCOUNTER — Ambulatory Visit
Admission: RE | Admit: 2017-09-23 | Discharge: 2017-09-23 | Disposition: A | Discharge status: Home / Self Care | Payer: BLUE CROSS/BLUE SHIELD | Source: Ambulatory Visit | Attending: Gastroenterology | Admitting: Gastroenterology

## 2017-09-23 DIAGNOSIS — Z09 Encounter for follow-up examination after completed treatment for conditions other than malignant neoplasm: Secondary | ICD-10-CM | POA: Diagnosis not present

## 2017-09-23 DIAGNOSIS — Z806 Family history of leukemia: Secondary | ICD-10-CM | POA: Diagnosis not present

## 2017-09-23 DIAGNOSIS — K5730 Diverticulosis of large intestine without perforation or abscess without bleeding: Secondary | ICD-10-CM | POA: Diagnosis not present

## 2017-09-23 DIAGNOSIS — Z800 Family history of malignant neoplasm of digestive organs: Secondary | ICD-10-CM | POA: Insufficient documentation

## 2017-09-23 DIAGNOSIS — Z808 Family history of malignant neoplasm of other organs or systems: Secondary | ICD-10-CM | POA: Insufficient documentation

## 2017-09-23 DIAGNOSIS — K5792 Diverticulitis of intestine, part unspecified, without perforation or abscess without bleeding: Secondary | ICD-10-CM | POA: Diagnosis not present

## 2017-09-23 DIAGNOSIS — K5732 Diverticulitis of large intestine without perforation or abscess without bleeding: Secondary | ICD-10-CM

## 2017-09-23 DIAGNOSIS — M1990 Unspecified osteoarthritis, unspecified site: Secondary | ICD-10-CM | POA: Insufficient documentation

## 2017-09-23 DIAGNOSIS — Z87891 Personal history of nicotine dependence: Secondary | ICD-10-CM | POA: Diagnosis not present

## 2017-09-23 DIAGNOSIS — Z8261 Family history of arthritis: Secondary | ICD-10-CM | POA: Insufficient documentation

## 2017-09-23 DIAGNOSIS — F329 Major depressive disorder, single episode, unspecified: Secondary | ICD-10-CM | POA: Insufficient documentation

## 2017-09-23 DIAGNOSIS — K5790 Diverticulosis of intestine, part unspecified, without perforation or abscess without bleeding: Secondary | ICD-10-CM | POA: Diagnosis not present

## 2017-09-23 DIAGNOSIS — Z8249 Family history of ischemic heart disease and other diseases of the circulatory system: Secondary | ICD-10-CM | POA: Insufficient documentation

## 2017-09-23 HISTORY — PX: COLONOSCOPY WITH PROPOFOL: SHX5780

## 2017-09-23 LAB — POCT PREGNANCY, URINE: PREG TEST UR: NEGATIVE

## 2017-09-23 SURGERY — COLONOSCOPY WITH PROPOFOL
Anesthesia: General

## 2017-09-23 MED ORDER — FENTANYL CITRATE (PF) 100 MCG/2ML IJ SOLN
INTRAMUSCULAR | Status: DC | PRN
  Administered 2017-09-23: 100 ug via INTRAVENOUS

## 2017-09-23 MED ORDER — FENTANYL CITRATE (PF) 100 MCG/2ML IJ SOLN
INTRAMUSCULAR | Status: AC
  Filled 2017-09-23: qty 2

## 2017-09-23 MED ORDER — LIDOCAINE 2% (20 MG/ML) 5 ML SYRINGE
INTRAMUSCULAR | Status: DC | PRN
  Administered 2017-09-23: 30 mg via INTRAVENOUS

## 2017-09-23 MED ORDER — PROPOFOL 10 MG/ML IV BOLUS
INTRAVENOUS | Status: AC
  Filled 2017-09-23: qty 20

## 2017-09-23 MED ORDER — SODIUM CHLORIDE 0.9 % IV SOLN
INTRAVENOUS | Status: DC
  Administered 2017-09-23 (×2): via INTRAVENOUS

## 2017-09-23 MED ORDER — PROPOFOL 10 MG/ML IV BOLUS
INTRAVENOUS | Status: DC | PRN
  Administered 2017-09-23: 100 mg via INTRAVENOUS

## 2017-09-23 MED ORDER — PROPOFOL 500 MG/50ML IV EMUL
INTRAVENOUS | Status: AC
Start: 2017-09-23 — End: 2017-09-23
  Filled 2017-09-23: qty 50

## 2017-09-23 MED ORDER — PROPOFOL 500 MG/50ML IV EMUL
INTRAVENOUS | Status: DC | PRN
  Administered 2017-09-23: 160 ug/kg/min via INTRAVENOUS

## 2017-09-23 MED ORDER — EPHEDRINE SULFATE 50 MG/ML IJ SOLN
INTRAMUSCULAR | Status: DC | PRN
  Administered 2017-09-23: 10 mg via INTRAVENOUS

## 2017-09-23 NOTE — Anesthesia Post-op Follow-up Note (Signed)
Anesthesia QCDR form completed.        

## 2017-09-23 NOTE — Op Note (Signed)
[facility] Gastroenterology Patient Name: Peggy Rogers Procedure Date: 09/23/2017 11:06 AM MRN: [id number] Account #: [id number] Date of Birth: [birth year] Admit Type: Outpatient Age: 42 Room: [facility] ENDO ROOM 1 Gender: Female Note Status: Finalized Procedure:            Colonoscopy Indications:          Follow-up of diverticula Providers:            Wyline Mood MD, MD Referring MD:         Pasty Spillers Mclean-Scocuzza MD, MD (Referring MD) Medicines:            Monitored Anesthesia Care Complications:        No immediate complications. Procedure:            Pre-Anesthesia Assessment:                       - Prior to the procedure, a History and Physical was                        performed, and patient medications, allergies and                        sensitivities were reviewed. The patient's tolerance of                        previous anesthesia was reviewed.                       - The risks and benefits of the procedure and the                        sedation options and risks were discussed with the                        patient. All questions were answered and informed                        consent was obtained.                       - ASA Grade Assessment: II - A patient with mild                        systemic disease.                       After obtaining informed consent, the colonoscope was                        passed under direct vision. Throughout the procedure,                        the patient's blood pressure, pulse, and oxygen                        saturations were monitored continuously. The                        Colonoscope was introduced through the anus and  advanced to the the cecum, identified by the                        appendiceal orifice, IC valve and transillumination.                        The colonoscopy was performed with ease. The patient                        tolerated the procedure well. The quality  of the bowel                        preparation was good. Findings:      The perianal and digital rectal examinations were normal.      Multiple small-mouthed diverticula were found in the sigmoid colon.      The exam was otherwise without abnormality on direct and retroflexion       views. Impression:           - Diverticulosis in the sigmoid colon.                       - The examination was otherwise normal on direct and                        retroflexion views.                       - No specimens collected. Recommendation:       - Discharge patient to home (with escort).                       - Resume previous diet.                       - Continue present medications.                       - Repeat colonoscopy in 10 years for screening purposes. Procedure Code(s):    --- Professional ---                       [phone], Colonoscopy, flexible; diagnostic, including                        collection of specimen(s) by brushing or washing, when                        performed (separate procedure) Diagnosis Code(s):    --- Professional ---                       K57.30, Diverticulosis of large intestine without                        perforation or abscess without bleeding CPT copyright 2016 American Medical Association. All rights reserved. The codes documented in this report are preliminary and upon coder review may  be revised to meet current compliance requirements. Wyline MoodKiran Lougenia Morrissey, MD Wyline MoodKiran Barbara Ahart MD, MD 09/23/2017 1:36:12 PM This report has been signed electronically. Number of Addenda: 0 Note Initiated On: 09/23/2017 11:06 AM Scope Withdrawal Time: 0 hours 10 minutes 58 seconds  Total Procedure Duration: 0 hours  14 minutes 58 seconds       [facility]

## 2017-09-23 NOTE — Transfer of Care (Signed)
Immediate Anesthesia Transfer of Care Note  Patient: Peggy Rogers  Procedure(s) Performed: COLONOSCOPY WITH PROPOFOL (N/A )  Patient Location: PACU and Endoscopy Unit  Anesthesia Type:General  Level of Consciousness: sedated  Airway & Oxygen Therapy: Patient Spontanous Breathing and Patient connected to nasal cannula oxygen  Post-op Assessment: Report given to RN and Post -op Vital signs reviewed and stable  Post vital signs: Reviewed and stable  Last Vitals:  Vitals:   09/23/17 1225  BP: (!) 141/81  Pulse: 68  Resp: 16  Temp: (!) 36.4 C  SpO2: 100%    Last Pain:  Vitals:   09/23/17 1225  TempSrc: Tympanic         Complications: No apparent anesthesia complications

## 2017-09-23 NOTE — Anesthesia Preprocedure Evaluation (Signed)
Anesthesia Evaluation  Patient identified by MRN, date of birth, ID band Patient awake    Reviewed: Allergy & Precautions, H&P , NPO status , [name] Chart, lab work & pertinent test results, reviewed documented beta blocker date and time   History of Anesthesia Complications Negative for: history of anesthetic complications  Airway Mallampati: I  TM Distance: >3 FB Neck ROM: full    Dental  (+) Caps, Dental Advidsory Given, Teeth Intact   Pulmonary neg pulmonary ROS, former smoker,           Cardiovascular Exercise Tolerance: Good negative cardio ROS       Neuro/Psych  Headaches, neg Seizures negative psych ROS   GI/Hepatic negative GI ROS, Neg liver ROS,   Endo/Other  negative endocrine ROS  Renal/GU negative Renal ROS  negative genitourinary   Musculoskeletal   Abdominal   Peds  Hematology negative hematology ROS (+)   Anesthesia Other Findings Past Medical History: No date: Allergy No date: Arthritis     Comment:  right>left knee pt helped and dry needling  No date: Arthritis     Comment:  right index PIP nodule  No date: Depression No date: Diverticulitis No date: Migraine   Reproductive/Obstetrics negative OB ROS                             Anesthesia Physical Anesthesia Plan  ASA: II  Anesthesia Plan: General   Post-op Pain Management:    Induction: Intravenous  PONV Risk Score and Plan: 3 and Propofol infusion  Airway Management Planned: Natural Airway and Nasal Cannula  Additional Equipment:   Intra-op Plan:   Post-operative Plan:   Informed Consent: I have reviewed the patients History and Physical, chart, labs and discussed the procedure including the risks, benefits and alternatives for the proposed anesthesia with the patient or authorized representative who has indicated his/her understanding and acceptance.   Dental Advisory Given  Plan Discussed  with: Anesthesiologist, CRNA and Surgeon  Anesthesia Plan Comments:         Anesthesia Quick Evaluation

## 2017-09-23 NOTE — H&P (Signed)
Peggy MoodKiran Aaleyah Witherow, MD [address], [address], [place], [place], [address] [address], [address], [place], [place], [address] Phone: [phone]  Fax: [phone]  Primary Care Physician:  McLean-Scocuzza, Pasty Spillersracy N, MD   Pre-Procedure History & Physical: HPI:  Peggy Rogers is a 42 y.o. female is here for an colonoscopy.   Past Medical History:  Diagnosis Date  . Allergy   . Arthritis    right>left knee pt helped and dry needling   . Arthritis    right index PIP nodule   . Depression   . Diverticulitis   . Migraine     Past Surgical History:  Procedure Laterality Date  . TUBAL LIGATION     02/1997    Prior to Admission medications   Medication Sig Start Date End Date Taking? Authorizing Provider  Cholecalciferol 50000 units capsule Take 1 capsule (50,000 Units total) by mouth once a week. 09/16/17  Yes McLean-Scocuzza, Pasty Spillersracy N, MD  Probiotic Product (PROBIOTIC DAILY PO) Take by mouth.   Yes [provider]    Allergies as of 09/16/2017 - Review Complete 09/16/2017  Allergen Reaction Noted  . Penicillins Rash 07/20/2017    Family History  Problem Relation Age of Onset  . Cancer Mother        skin  . Hearing loss Mother   . Heart disease Mother   . Hypertension Mother   . Cancer Father        esophageal smoker   . Early death Father   . Cancer Maternal Grandmother        skin  . Diabetes Maternal Grandfather   . Heart disease Maternal Grandfather   . Hypertension Maternal Grandfather   . Cancer Paternal Grandmother        skin  . Arthritis Paternal Grandfather   . Cancer Paternal Grandfather        leukemia  . Hearing loss Paternal Grandfather     Social History   Socioeconomic History  . Marital status: Married    Spouse name: Not on file  . Number of children: Not on file  . Years of education: Not on file  . Highest education level: Not on file  Social Needs  . Financial resource strain: Not on file  . Food insecurity - worry:  Not on file  . Food insecurity - inability: Not on file  . Transportation needs - medical: Not on file  . Transportation needs - non-medical: Not on file  Occupational History  . Not on file  Tobacco Use  . Smoking status: Former Games developermoker  . Smokeless tobacco: Never Used  . Tobacco comment: former smoker quit 1 week ago 08/2017 since age 42 y.o   Substance and Sexual Activity  . Alcohol use: Yes  . Drug use: No  . Sexual activity: Yes    Partners: Female  Other Topics Concern  . 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 Neck:  Supple; no masses or thyromegaly. Lungs:  Clear throughout to auscultation, normal respiratory effort.    Heart:  +S1, +S2, Regular rate and rhythm, No edema. Abdomen:  Soft, nontender and nondistended. Normal bowel sounds, without guarding, and without rebound.   Neurologic:  Alert and  oriented x4;  grossly normal neurologically.  Impression/Plan: Peggy Rogers is here for an colonoscopy to be performed for diverticulitis Risks, benefits, limitations, and alternatives regarding  colonoscopy have been reviewed with the patient.  Questions have been answered.  All parties agreeable.   Peggy Mood, MD  09/23/2017, 12:35 PM

## 2017-09-24 NOTE — Anesthesia Postprocedure Evaluation (Signed)
Anesthesia Post Note  Patient: Peggy Rogers  Procedure(s) Performed: COLONOSCOPY WITH PROPOFOL (N/A )  Patient location during evaluation: Endoscopy Anesthesia Type: General Level of consciousness: awake and alert Pain management: pain level controlled Vital Signs Assessment: post-procedure vital signs reviewed and stable Respiratory status: spontaneous breathing, nonlabored ventilation, respiratory function stable and patient connected to nasal cannula oxygen Cardiovascular status: blood pressure returned to baseline and stable Postop Assessment: no apparent nausea or vomiting Anesthetic complications: no     Last Vitals:  Vitals Value Taken Time  BP    Temp    Pulse    Resp    SpO2      Last Pain:  Vitals:   09/23/17 1342  TempSrc: Tympanic                 Lenard SimmerAndrew Idolina Mantell

## 2017-09-25 ENCOUNTER — Encounter: Payer: Self-pay | Admitting: Gastroenterology

## 2017-09-29 ENCOUNTER — Encounter: Payer: Self-pay | Admitting: Internal Medicine

## 2017-09-29 ENCOUNTER — Other Ambulatory Visit (HOSPITAL_COMMUNITY)
Admission: RE | Admit: 2017-09-29 | Discharge: 2017-09-29 | Disposition: A | Discharge status: Home / Self Care | Payer: BLUE CROSS/BLUE SHIELD | Source: Ambulatory Visit | Attending: Internal Medicine | Admitting: Internal Medicine

## 2017-09-29 ENCOUNTER — Ambulatory Visit (INDEPENDENT_AMBULATORY_CARE_PROVIDER_SITE_OTHER): Payer: BLUE CROSS/BLUE SHIELD | Admitting: Internal Medicine

## 2017-09-29 VITALS — BP 120/80 | HR 57 | Temp 98.2°F | Ht 63.5 in | Wt 220.0 lb

## 2017-09-29 DIAGNOSIS — K5792 Diverticulitis of intestine, part unspecified, without perforation or abscess without bleeding: Secondary | ICD-10-CM | POA: Diagnosis not present

## 2017-09-29 DIAGNOSIS — Z113 Encounter for screening for infections with a predominantly sexual mode of transmission: Secondary | ICD-10-CM | POA: Diagnosis not present

## 2017-09-29 DIAGNOSIS — Z01419 Encounter for gynecological examination (general) (routine) without abnormal findings: Secondary | ICD-10-CM | POA: Diagnosis not present

## 2017-09-29 DIAGNOSIS — B373 Candidiasis of vulva and vagina: Secondary | ICD-10-CM | POA: Insufficient documentation

## 2017-09-29 DIAGNOSIS — Z124 Encounter for screening for malignant neoplasm of cervix: Secondary | ICD-10-CM | POA: Insufficient documentation

## 2017-09-29 DIAGNOSIS — E559 Vitamin D deficiency, unspecified: Secondary | ICD-10-CM | POA: Diagnosis not present

## 2017-09-29 NOTE — Progress Notes (Signed)
Pre visit review using our clinic review tool, if applicable. No additional management support is needed unless otherwise documented below in the visit note. 

## 2017-09-29 NOTE — Patient Instructions (Addendum)
Let me know about the mammogram  Take vitamin D3 2000 IU daily after 6 months of weekly vitamin D   Hepatitis B Vaccine, Recombinant injection What is this medicine? HEPATITIS B VACCINE (hep uh TAHY tis B VAK seen) is a vaccine. It is used to prevent an infection with the hepatitis B virus. This medicine may be used for other purposes; ask your health care provider or pharmacist if you have questions. COMMON BRAND NAME(S): Engerix-B, Recombivax HB What should I tell my health care provider before I take this medicine? They need to know if you have any of these conditions: -fever, infection -heart disease -hepatitis B infection -immune system problems -kidney disease -an unusual or allergic reaction to vaccines, yeast, other medicines, foods, dyes, or preservatives -pregnant or trying to get pregnant -breast-feeding How should I use this medicine? This vaccine is for injection into a muscle. It is given by a health care professional. A copy of Vaccine Information Statements will be given before each vaccination. Read this sheet carefully each time. The sheet may change frequently. Talk to your pediatrician regarding the use of this medicine in children. While this drug may be prescribed for children as young as newborn for selected conditions, precautions do apply. Overdosage: If you think you have taken too much of this medicine contact a poison control center or emergency room at once. NOTE: This medicine is only for you. Do not share this medicine with others. What if I miss a dose? It is important not to miss your dose. Call your doctor or health care professional if you are unable to keep an appointment. What may interact with this medicine? -medicines that suppress your immune function like adalimumab, anakinra, infliximab -medicines to treat cancer -steroid medicines like prednisone or cortisone This list may not describe all possible interactions. Give your health care provider  a list of all the medicines, herbs, non-prescription drugs, or dietary supplements you use. Also tell them if you smoke, drink alcohol, or use illegal drugs. Some items may interact with your medicine. What should I watch for while using this medicine? See your health care provider for all shots of this vaccine as directed. You must have 3 shots of this vaccine for protection from hepatitis B infection. Tell your doctor right away if you have any serious or unusual side effects after getting this vaccine. What side effects may I notice from receiving this medicine? Side effects that you should report to your doctor or health care professional as soon as possible: -allergic reactions like skin rash, itching or hives, swelling of the face, lips, or tongue -breathing problems -confused, irritated -fast, irregular heartbeat -flu-like syndrome -numb, tingling pain -seizures -unusually weak or tired Side effects that usually do not require medical attention (report to your doctor or health care professional if they continue or are bothersome): -diarrhea -fever -headache -loss of appetite -muscle pain -nausea -pain, redness, swelling, or irritation at site where injected -tiredness This list may not describe all possible side effects. Call your doctor for medical advice about side effects. You may report side effects to FDA at 1-800-FDA-1088. Where should I keep my medicine? This drug is given in a hospital or clinic and will not be stored at home. NOTE: This sheet is a summary. It may not cover all possible information. If you have questions about this medicine, talk to your doctor, pharmacist, or health care provider.  2018 Elsevier/Gold Standard (2013-10-24 13:26:01) Results for Peggy Rogers, Peggy Rogers (MRN [id number]) as of 09/29/2017  10:10  Ref. Range 07/20/2017 19:33 09/08/2017 08:52  Sodium Latest Ref Range: 135 - 145 mmol/L 138   Potassium Latest Ref Range: 3.5 - 5.1 mmol/L 4.1   Chloride  Latest Ref Range: 101 - 111 mmol/L 102   CO2 Latest Ref Range: 22 - 32 mmol/L 25   Glucose Latest Ref Range: 65 - 99 mg/dL 96   BUN Latest Ref Range: 6 - 20 mg/dL 13   Creatinine Latest Ref Range: 0.44 - 1.00 mg/dL 1.610.85   Calcium Latest Ref Range: 8.9 - 10.3 mg/dL 9.4   Anion gap Latest Ref Range: 5 - 15  11   Alkaline Phosphatase Latest Ref Range: 38 - 126 U/L 77   Albumin Latest Ref Range: 3.5 - 5.0 g/dL 4.2   Lipase Latest Ref Range: 11 - 51 U/L 29   AST Latest Ref Range: 15 - 41 U/L 22   ALT Latest Ref Range: 14 - 54 U/L 17   Total Protein Latest Ref Range: 6.5 - 8.1 g/dL 8.1   Total Bilirubin Latest Ref Range: 0.3 - 1.2 mg/dL 0.4   GFR, Est Non African American Latest Ref Range: >60 mL/min >60   GFR, Est African American Latest Ref Range: >60 mL/min >60   Total CHOL/HDL Ratio Unknown  3  Cholesterol Latest Ref Range: 0 - 200 mg/dL  096175  HDL Cholesterol Latest Ref Range: >39.00 mg/dL  04.5455.40  LDL (calc) Latest Ref Range: 0 - 99 mg/dL  098107 (H)  NonHDL Unknown  119.91  Triglycerides Latest Ref Range: 0.0 - 149.0 mg/dL  11.964.0  VLDL Latest Ref Range: 0.0 - 40.0 mg/dL  14.712.8  VITD Latest Ref Range: 30.00 - 100.00 ng/mL  14.12 (L)  WBC Latest Ref Range: 3.6 - 11.0 K/uL 9.3   RBC Latest Ref Range: 3.80 - 5.20 MIL/uL 4.70   Hemoglobin Latest Ref Range: 12.0 - 16.0 g/dL 82.912.8   HCT Latest Ref Range: 35.0 - 47.0 % 38.4   MCV Latest Ref Range: 80.0 - 100.0 fL 81.6   MCH Latest Ref Range: 26.0 - 34.0 pg 27.3   MCHC Latest Ref Range: 32.0 - 36.0 g/dL 56.233.4   RDW Latest Ref Range: 11.5 - 14.5 % 14.2   Platelets Latest Ref Range: 150 - 440 K/uL 369   Preg Test, Ur Latest Ref Range: NEGATIVE  NEGATIVE   TSH Latest Ref Range: 0.35 - 4.50 uIU/mL  2.23  T4,Free(Direct) Latest Ref Range: 0.60 - 1.60 ng/dL  1.300.76  RPR Latest Ref Range: NON-REACTI   NON-REACTIVE  HSV 1 Glycoprotein G Ab, IgG Latest Units: index  49.20 (H)  HSV 2 Glycoprotein G Ab, IgG Latest Units: index  <0.90  Hepatitis B Surface Ag  Latest Ref Range: NON-REACTI   NON-REACTIVE  Hepatitis B-Post Latest Ref Range: > OR = 10 mIU/mL  <5 (L)  Hepatitis C Ab Latest Ref Range: NON-REACTI   NON-REACTIVE  SIGNAL TO CUT-OFF Latest Ref Range: <1.00   0.02  HIV Latest Ref Range: NON-REACTI   NON-REACTIVE  Appearance Latest Ref Range: CLEAR  CLEAR (A)   Bilirubin Urine Latest Ref Range: NEGATIVE  NEGATIVE   Color, Urine Latest Ref Range: YELLOW  YELLOW (A)   Glucose Latest Ref Range: NEGATIVE mg/dL NEGATIVE   Hgb urine dipstick Latest Ref Range: NEGATIVE  NEGATIVE   Ketones, ur Latest Ref Range: NEGATIVE mg/dL NEGATIVE   Leukocytes, UA Latest Ref Range: NEGATIVE  NEGATIVE   Nitrite Latest Ref Range: NEGATIVE  NEGATIVE   pH  Latest Ref Range: 5.0 - 8.0  6.0   Protein Latest Ref Range: NEGATIVE mg/dL NEGATIVE   Specific Gravity, Urine Latest Ref Range: 1.005 - 1.030  1.006   Bacteria, UA Latest Ref Range: NONE SEEN  NONE SEEN   Mucus Unknown PRESENT   RBC / HPF Latest Ref Range: 0 - 5 RBC/hpf 0-5   Squamous Epithelial / LPF Latest Ref Range: NONE SEEN  0-5 (A)   WBC, UA Latest Ref Range: 0 - 5 WBC/hpf 0-5

## 2017-09-29 NOTE — Progress Notes (Signed)
Chief Complaint  Patient presents with  . Follow-up   F/u  1. 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 Needs pap today   Review of Systems  Constitutional: Negative for weight loss.  HENT: Negative for hearing loss.   Eyes: Negative for blurred vision.  Respiratory: Negative for shortness of breath.   Cardiovascular: Negative for chest pain.  Gastrointestinal: Negative for abdominal pain.  Skin: Negative for rash.  Psychiatric/Behavioral: Negative for depression.   Past Medical History:  Diagnosis Date  . Allergy   . Arthritis    right>left knee pt helped and dry needling   . Arthritis    right index PIP nodule   . Depression   . Diverticulitis   . Migraine    Past Surgical History:  Procedure Laterality Date  . COLONOSCOPY WITH PROPOFOL N/A 09/23/2017   Procedure: COLONOSCOPY WITH PROPOFOL;  Surgeon: Jonathon Bellows, MD;  Location: [facility] ENDOSCOPY;  Service: Gastroenterology;  Laterality: N/A;  . TUBAL LIGATION     02/1997   Family History  Problem Relation Age of Onset  . Cancer Mother        skin  . Hearing loss Mother   . Heart disease Mother   . Hypertension Mother   . Cancer Father        esophageal smoker   . Early death Father   . Cancer Maternal Grandmother        skin  . Diabetes Maternal Grandfather   . Heart disease Maternal Grandfather   . Hypertension Maternal Grandfather   . Cancer Paternal Grandmother        skin  . Arthritis Paternal Grandfather   . Cancer Paternal Grandfather        leukemia  . Hearing loss Paternal Grandfather    Social History   Socioeconomic History  . Marital status: Married    Spouse name: Not on file  . Number of children: Not on file  . Years of education: Not on file  . Highest education level: Not on file  Occupational History  . Not on file  Social Needs  . Financial resource strain: Not on file  . Food insecurity:    Worry: Not on file   Inability: Not on file  . Transportation needs:    Medical: Not on file    Non-medical: Not on file  Tobacco Use  . Smoking status: Former Research scientist (life sciences)  . Smokeless tobacco: Never Used  . Tobacco comment: former smoker quit 1 week ago 08/2017 since age 8 y.o   Substance and Sexual Activity  . Alcohol use: Yes  . Drug use: No  . Sexual activity: Yes    Partners: Female  Lifestyle  . Physical activity:    Days per week: Not on file    Minutes per session: Not on file  . Stress: Not on file  Relationships  . Social connections:    Talks on phone: Not on file    Gets together: Not on file    Attends religious service: Not on file    Active member of club or organization: Not on file    Attends meetings of clubs or organizations: Not on file    Relationship status: Not on file  . Intimate partner violence:    Fear of current or ex partner: Not on file    Emotionally abused: Not on file    Physically  abused: Not on file    Forced sexual activity: Not on file  Other Topics Concern  . 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 Cholecalciferol 50000 units capsule Take 1 capsule (50,000 Units total) by mouth once a week.  . Probiotic Product (PROBIOTIC DAILY PO) Take by mouth.   Allergies  Allergen Reactions  . Penicillins Rash   Recent Results (from the past 2160 hour(s))  Lipase, blood     Status: None   Collection Time: 07/20/17  7:33 PM  Result Value Ref Range   Lipase 29 11 - 51 U/L    Comment: Performed at [facility], [address] [place], [place] [address]  Comprehensive metabolic panel     Status: None   Collection Time: 07/20/17  7:33 PM  Result Value Ref Range   Sodium 138 135 - 145 mmol/L   Potassium 4.1 3.5 - 5.1 mmol/L    Chloride 102 101 - 111 mmol/L   CO2 25 22 - 32 mmol/L   Glucose, Bld 96 65 - 99 mg/dL   BUN 13 6 - 20 mg/dL   Creatinine, Ser 0.85 0.44 - 1.00 mg/dL   Calcium 9.4 8.9 - 10.3 mg/dL   Total Protein 8.1 6.5 - 8.1 g/dL   Albumin 4.2 3.5 - 5.0 g/dL   AST 22 15 - 41 U/L   ALT 17 14 - 54 U/L   Alkaline Phosphatase 77 38 - 126 U/L   Total Bilirubin 0.4 0.3 - 1.2 mg/dL   GFR calc non Af Amer >60 >60 mL/min   GFR calc Af Amer >60 >60 mL/min    Comment: (NOTE) The eGFR has been calculated using the CKD EPI equation. This calculation has not been validated in all clinical situations. eGFR's persistently <60 mL/min signify possible Chronic Kidney Disease.    Anion gap 11 5 - 15    Comment: Performed at [facility], [address] [place], [place] [address]  CBC     Status: None   Collection Time: 07/20/17  7:33 PM  Result Value Ref Range   WBC 9.3 3.6 - 11.0 K/uL   RBC 4.70 3.80 - 5.20 MIL/uL   Hemoglobin 12.8 12.0 - 16.0 g/dL   HCT 38.4 35.0 - 47.0 %   MCV 81.6 80.0 - 100.0 fL   MCH 27.3 26.0 - 34.0 pg   MCHC 33.4 32.0 - 36.0 g/dL   RDW 14.2 11.5 - 14.5 %   Platelets 369 150 - 440 K/uL    Comment: Performed at [facility], [address] [place], [place] [address]  Urinalysis, Complete w Microscopic     Status: Abnormal   Collection Time: 07/20/17  7:33 PM  Result Value Ref Range   Color, Urine YELLOW (A) YELLOW   APPearance CLEAR (A) CLEAR   Specific Gravity, Urine 1.006 1.005 - 1.030   pH 6.0 5.0 - 8.0   Glucose, UA NEGATIVE NEGATIVE mg/dL   Hgb urine dipstick NEGATIVE NEGATIVE   Bilirubin Urine NEGATIVE NEGATIVE   Ketones, ur NEGATIVE NEGATIVE mg/dL   Protein, ur NEGATIVE NEGATIVE mg/dL   Nitrite NEGATIVE NEGATIVE   Leukocytes, UA  NEGATIVE NEGATIVE   RBC / HPF 0-5 0 - 5 RBC/hpf   WBC, UA 0-5 0 - 5 WBC/hpf   Bacteria, UA NONE SEEN NONE SEEN   Squamous Epithelial / LPF 0-5 (A) NONE SEEN   Mucus PRESENT     Comment: Performed at [facility],  [address], [place], [place] [address]  Pregnancy, urine     Status: None   Collection Time: 07/20/17  7:33 PM  Result Value Ref Range   Preg Test, Ur NEGATIVE NEGATIVE    Comment: Performed at [facility], [address] [place], [place] [address]  HSV 2 antibody, IgG     Status: None   Collection Time: 09/08/17  8:52 AM  Result Value Ref Range   HSV 2 Glycoprotein G Ab, IgG <0.90 index    Comment:                           Index          Interpretation                           -----          --------------                           <0.90          Negative                           0.90-1.09      Equivocal                           >1.09          Positive . This assay utilizes recombinant type-specific antigens to differentiate HSV-1 from HSV-2 infections. A positive result cannot distinguish between recent and past infection. If recent HSV infection is suspected but the results are negative or equivocal, the assay should be repeated in 4-6 weeks. The performance characteristics of the assay have not been established for pediatric populations, immunocompromised patients, or neonatal screening.   HSV 1 antibody, IgG     Status: Abnormal   Collection Time: 09/08/17  8:52 AM  Result Value Ref Range   HSV 1 Glycoprotein G Ab, IgG 49.20 (H) index    Comment:                           Index          Interpretation                           -----          --------------                           <0.90          Negative                           0.90-1.09      Equivocal                           >1.09  Positive . This assay utilizes recombinant type-specific antigens to differentiate HSV-1 from HSV-2 infections. A positive result cannot distinguish between recent and past infection. If recent HSV infection is suspected but the results are negative or equivocal, the assay should be repeated in 4-6 weeks. The performance characteristics of the assay have not  been established for pediatric populations, immunocompromised patients, or neonatal screening.   Hepatitis C antibody     Status: None   Collection Time: 09/08/17  8:52 AM  Result Value Ref Range   Hepatitis C Ab NON-REACTIVE NON-REACTI   SIGNAL TO CUT-OFF 0.02 <1.00    Comment: . HCV antibody was non-reactive. There is no laboratory  evidence of HCV infection. . In most cases, no further action is required. However, if recent HCV exposure is suspected, a test for HCV RNA (test code [phone]) is suggested. . For additional information please refer to http://education.[facility].com/faq/FAQ22v1 (This link is being provided for informational/ educational purposes only.) .   Hepatitis B surface antigen     Status: None   Collection Time: 09/08/17  8:52 AM  Result Value Ref Range   Hepatitis B Surface Ag NON-REACTIVE NON-REACTI  Hepatitis B surface antibody     Status: Abnormal   Collection Time: 09/08/17  8:52 AM  Result Value Ref Range   Hepatitis B-Post <5 (L) > OR = 10 mIU/mL    Comment: . Patient does not have immunity to hepatitis B virus. . For additional information, please refer to http://education.[facility].com/faq/FAQ105 (This link is being provided for informational/ educational purposes only).   HIV antibody (with reflex)     Status: None   Collection Time: 09/08/17  8:52 AM  Result Value Ref Range   HIV 1&2 Ab, 4th Generation NON-REACTIVE NON-REACTI    Comment: HIV-1 antigen and HIV-1/HIV-2 antibodies were not detected. There is no laboratory evidence of HIV infection. Marland Kitchen PLEASE NOTE: This information has been disclosed to you from records whose confidentiality may be protected by state law.  If your state requires such protection, then the state law prohibits you from making any further disclosure of the information without the specific written consent of the person to whom it pertains, or as otherwise permitted by law. A general authorization  for the release of medical or other information is NOT sufficient for this purpose. . For additional information please refer to http://education.[facility].com/faq/FAQ106 (This link is being provided for informational/ educational purposes only.) . Marland Kitchen The performance of this assay has not been clinically validated in patients less than 47 years old. .   RPR     Status: None   Collection Time: 09/08/17  8:52 AM  Result Value Ref Range   RPR Ser Ql NON-REACTIVE NON-REACTI  Vitamin D (25 hydroxy)     Status: Abnormal   Collection Time: 09/08/17  8:52 AM  Result Value Ref Range   VITD 14.12 (L) 30.00 - 100.00 ng/mL  T4, free     Status: None   Collection Time: 09/08/17  8:52 AM  Result Value Ref Range   Free T4 0.76 0.60 - 1.60 ng/dL    Comment: Specimens from patients who are undergoing biotin therapy and /or ingesting biotin supplements may contain high levels of biotin.  The higher biotin concentration in these specimens interferes with this Free T4 assay.  Specimens that contain high levels  of biotin may cause false high results for this Free T4 assay.  Please interpret results in light of the total clinical presentation of the patient.  TSH     Status: None   Collection Time: 09/08/17  8:52 AM  Result Value Ref Range   TSH 2.23 0.35 - 4.50 uIU/mL  Lipid panel     Status: Abnormal   Collection Time: 09/08/17  8:52 AM  Result Value Ref Range   Cholesterol 175 0 - 200 mg/dL    Comment: ATP III Classification       Desirable:  < 200 mg/dL               Borderline High:  200 - 239 mg/dL          High:  > = 240 mg/dL   Triglycerides 64.0 0.0 - 149.0 mg/dL    Comment: Normal:  <150 mg/dLBorderline High:  150 - 199 mg/dL   HDL 55.40 >39.00 mg/dL   VLDL 12.8 0.0 - 40.0 mg/dL   LDL Cholesterol 107 (H) 0 - 99 mg/dL   Total CHOL/HDL Ratio 3     Comment:                Men          Women1/2 Average Risk     3.4          3.3Average Risk          5.0          4.42X Average Risk           9.6          7.13X Average Risk          15.0          11.0                       NonHDL 119.91     Comment: NOTE:  Non-HDL goal should be 30 mg/dL higher than patient's LDL goal (i.e. LDL goal of < 70 mg/dL, would have non-HDL goal of < 100 mg/dL)  Pregnancy, urine POC     Status: None   Collection Time: 09/23/17 12:31 PM  Result Value Ref Range   Preg Test, Ur NEGATIVE NEGATIVE    Comment:        THE SENSITIVITY OF THIS METHODOLOGY IS >24 mIU/mL    Objective  Body mass index is 38.36 kg/m. Wt Readings from Last 3 Encounters:  09/29/17 220 lb (99.8 kg)  09/23/17 220 lb (99.8 kg)  09/16/17 220 lb 6.4 oz (100 kg)   Temp Readings from Last 3 Encounters:  09/29/17 98.2 F (36.8 C) (Oral)  09/23/17 (!) 97 F (36.1 C) (Tympanic)  09/01/17 97.9 F (36.6 C) (Oral)   BP Readings from Last 3 Encounters:  09/29/17 120/80  09/23/17 134/78  09/16/17 121/85   Pulse Readings from Last 3 Encounters:  09/29/17 (!) 57  09/23/17 73  09/16/17 64    Physical Exam  Constitutional: She is oriented to person, place, and time and well-developed, well-nourished, and in no distress. Vital signs are normal.  HENT:  Head: Normocephalic and atraumatic.  Mouth/Throat: Oropharynx is clear and moist and mucous membranes are normal.  Eyes: Pupils are equal, round, and reactive to light. Conjunctivae are normal.  Cardiovascular: Normal rate, regular rhythm and normal heart sounds.  Pulmonary/Chest: Effort normal and breath sounds normal. Right breast exhibits no inverted nipple, no mass, no nipple discharge, no skin change and no tenderness. Left breast exhibits no inverted nipple, no mass, no nipple discharge, no skin change and no tenderness. Breasts are symmetrical.  Abdominal:  Soft. Bowel sounds are normal.  Genitourinary: Vagina normal, uterus normal, cervix normal, right adnexa normal, left adnexa normal and vulva normal.  Neurological: She is alert and oriented to person, place, and  time. Gait normal. Gait normal.  Skin: Skin is warm, dry and intact.  Psychiatric: Mood, memory, affect and judgment normal.  Nursing note and vitals reviewed.   Assessment   1. Vit D def  2. HM Plan   1. Weekly vit D x 6 months  2.  Declines flu shot  Tdap had 2012/2013  Reviewed labs rec hep B vaccine given info for review   Refer for mammogram 3d in [place] pt still has not had may go to [facility] if insurance does not cover will call back  And let me know where wants to go pt  Is moving soon to [place] so before move hopefully Pap today  09/23/17 colonoscopy normal repeat rec in 10 years given copy today  Last eye exam 2018   Also sees [facility] in [place] Dr. Knox Saliva since middle school ?macular degeneration mom has spots too  Dentist Dr. Johnnette Litter   Provider: Dr. Olivia Mackie McLean-Scocuzza-Internal Medicine

## 2017-10-01 ENCOUNTER — Encounter: Payer: Self-pay | Admitting: Internal Medicine

## 2017-10-01 DIAGNOSIS — E559 Vitamin D deficiency, unspecified: Secondary | ICD-10-CM | POA: Insufficient documentation

## 2017-10-02 LAB — CYTOLOGY - PAP
BACTERIAL VAGINITIS: NEGATIVE
CANDIDA VAGINITIS: POSITIVE — AB
Chlamydia: NEGATIVE
Diagnosis: NEGATIVE
HPV (WINDOPATH): NOT DETECTED
Neisseria Gonorrhea: NEGATIVE
Trichomonas: NEGATIVE

## 2017-10-04 ENCOUNTER — Other Ambulatory Visit: Payer: Self-pay | Admitting: Internal Medicine

## 2017-10-04 DIAGNOSIS — B3731 Acute candidiasis of vulva and vagina: Secondary | ICD-10-CM

## 2017-10-04 DIAGNOSIS — B373 Candidiasis of vulva and vagina: Secondary | ICD-10-CM

## 2017-10-04 MED ORDER — FLUCONAZOLE 150 MG PO TABS: 150.0000 mg | ORAL_TABLET | Freq: Once | ORAL | 0 refills | Status: AC

## 2019-06-14 ENCOUNTER — Telehealth: Payer: Self-pay | Admitting: *Deleted

## 2019-06-14 NOTE — Telephone Encounter (Signed)
Patient scheduled for back pain virtual on 06/15/19. Advised symptoms worsen to be evaluated sooner.

## 2019-06-14 NOTE — Telephone Encounter (Signed)
Copied from [place] [phone]. Topic: Quick Communication - See Telephone Encounter >> Jun 13, 2019  5:00 PM Loma Boston wrote: Reason for CRM:Pt states she had called in today 12/7 with back problems and was told they would try to work in, did not see encounter. Call back on Tues 12/8 for an appt to be sch cb at [phone]

## 2019-06-15 ENCOUNTER — Encounter: Payer: Self-pay | Admitting: Internal Medicine

## 2019-06-15 ENCOUNTER — Ambulatory Visit (INDEPENDENT_AMBULATORY_CARE_PROVIDER_SITE_OTHER): Payer: BLUE CROSS/BLUE SHIELD | Admitting: Internal Medicine

## 2019-06-15 DIAGNOSIS — R1030 Lower abdominal pain, unspecified: Secondary | ICD-10-CM

## 2019-06-15 DIAGNOSIS — R509 Fever, unspecified: Secondary | ICD-10-CM

## 2019-06-15 DIAGNOSIS — K5792 Diverticulitis of intestine, part unspecified, without perforation or abscess without bleeding: Secondary | ICD-10-CM

## 2019-06-15 MED ORDER — CIPROFLOXACIN HCL 500 MG PO TABS: 500.0000 mg | ORAL_TABLET | Freq: Two times a day (BID) | ORAL | 0 refills | Status: DC

## 2019-06-15 MED ORDER — METRONIDAZOLE 500 MG PO TABS: 500.0000 mg | ORAL_TABLET | Freq: Three times a day (TID) | ORAL | 0 refills | Status: DC

## 2019-06-15 MED ORDER — FLUCONAZOLE 150 MG PO TABS: 150.0000 mg | ORAL_TABLET | Freq: Once | ORAL | 0 refills | Status: AC

## 2019-06-15 NOTE — Progress Notes (Signed)
Virtual Visit via Video Note  I connected with Peggy Rogers  on 06/15/19 at  1:30 PM EST by a video enabled telemedicine application and verified that I am speaking with the correct person using two identifiers.  Location patient: car Location provider:work or home office Persons participating in the virtual visit: patient, provider  I discussed the limitations of evaluation and management by telemedicine and the availability of in person appointments. The patient expressed understanding and agreed to proceed.   HPI: C/o Monday lower ab cramping into her back thought was cycle but no cycle yet and also had nausea and fever to 101 tried Tylenol, rest and gatorade h/o diverticulitis in 2019 CT. She also c/o ab pressure, increase frequency and hesistancy no burning no h/o stones   Current no health insurance   ROS: See pertinent positives and negatives per HPI.  Past Medical History:  Diagnosis Date  . Allergy   . Arthritis    right>left knee pt helped and dry needling   . Arthritis    right index PIP nodule   . Depression   . Diverticulitis   . Migraine     Past Surgical History:  Procedure Laterality Date  . COLONOSCOPY WITH PROPOFOL N/A 09/23/2017   Procedure: COLONOSCOPY WITH PROPOFOL;  Surgeon: Wyline Mood, MD;  Location: [facility] ENDOSCOPY;  Service: Gastroenterology;  Laterality: N/A;  . TUBAL LIGATION     02/1997    Family History  Problem Relation Age of Onset  . Cancer Mother        skin  . Hearing loss Mother   . Heart disease Mother   . Hypertension Mother   . Cancer Father        esophageal smoker   . Early death Father   . Cancer Maternal Grandmother        skin  . Diabetes Maternal Grandfather   . Heart disease Maternal Grandfather   . Hypertension Maternal Grandfather   . Cancer Paternal Grandmother        skin  . Arthritis Paternal Grandfather   . Cancer Paternal Grandfather        leukemia  . 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  Probiotic Product (PROBIOTIC DAILY PO), Take by mouth., Disp: , Rfl:  .  Cholecalciferol 50000 units capsule, Take 1 capsule (50,000 Units total) by mouth once a week. (Patient not taking: Reported on 06/15/2019), Disp: 13 capsule, Rfl: 1 .  ciprofloxacin (CIPRO) 500 MG tablet, Take 1 tablet (500 mg total) by mouth 2 (two) times daily. With food, Disp: 14 tablet, Rfl: 0 .  fluconazole (DIFLUCAN) 150 MG tablet, Take 1 tablet (150 mg total) by mouth once for 1 dose., Disp: 1 tablet, Rfl: 0 .  metroNIDAZOLE (FLAGYL) 500 MG tablet, Take 1 tablet (500 mg total) by mouth 3 (three) times daily., Disp: 21 tablet, Rfl: 0  EXAM:  VITALS per patient if applicable:  GENERAL: alert, oriented, appears well and in no acute distress  HEENT: atraumatic, conjunttiva clear, no obvious abnormalities on inspection of external nose and ears  NECK: normal movements of the head and neck  LUNGS: on inspection no signs of respiratory distress, breathing rate appears normal, no obvious gross SOB, gasping or wheezing  CV: no obvious cyanosis  MS: moves all visible extremities without noticeable abnormality  PSYCH/NEURO: pleasant and cooperative, no obvious depression or anxiety, speech and thought processing grossly intact  ASSESSMENT AND PLAN:  Discussed the following assessment and plan:  Lower abdominal pain - Plan: Comprehensive metabolic panel, CBC with Differential/Platelet, Urinalysis, Routine w reflex microscopic, Urine Culture, ciprofloxacin (CIPRO) 500 MG tablet, metroNIDAZOLE (FLAGYL) 500 MG tablet, fluconazole (DIFLUCAN) 150 MG tablet empircally tx diverticulitis r/o UTI  Hold imaging currently no insurance  Prn Tylenol  Worse go to ED no insurance   HM Declines flu shot   Tdap had 2012/2013  Reviewed labs rec hep B vaccine given info for review   Disc [facility] $99 mammogram Pap 09/29/17 negative except yeast  09/23/17 colonoscopy normal repeat rec in 10 years given copy today  Last eye exam 2018   Also sees [facility] in [place] Dr. Knox Saliva since middle school ?macular degeneration mom has spots too  Dentist Dr. Johnnette Litter  -we discussed possible serious and likely etiologies, options for evaluation and workup, limitations of telemedicine visit vs in person visit, treatment, treatment risks and precautions. Pt prefers to treat via telemedicine empirically rather then risking or undertaking an in person visit at this moment. Patient agrees to seek prompt in person care if worsening, new symptoms arise, or if is not improving with treatment.   I discussed the assessment and treatment plan with the patient. The patient was provided an opportunity to ask questions and all were answered. The patient agreed with the plan and demonstrated an understanding of the instructions.   The patient was advised to call back or seek an in-person evaluation if the symptoms worsen or if the condition fails to improve as anticipated.  Time spent 20 minutes  Delorise Jackson, MD

## 2019-06-17 LAB — CBC WITH DIFFERENTIAL/PLATELET
Basophils Absolute: 0.1 10*3/uL (ref 0.0–0.2)
Basos: 1 %
EOS (ABSOLUTE): 0.1 10*3/uL (ref 0.0–0.4)
Eos: 2 %
Hematocrit: 37.1 % (ref 34.0–46.6)
Hemoglobin: 12 g/dL (ref 11.1–15.9)
Immature Grans (Abs): 0 10*3/uL (ref 0.0–0.1)
Immature Granulocytes: 0 %
Lymphocytes Absolute: 1.7 10*3/uL (ref 0.7–3.1)
Lymphs: 21 %
MCH: 27.5 pg (ref 26.6–33.0)
MCHC: 32.3 g/dL (ref 31.5–35.7)
MCV: 85 fL (ref 79–97)
Monocytes Absolute: 0.6 10*3/uL (ref 0.1–0.9)
Monocytes: 8 %
Neutrophils Absolute: 5.5 10*3/uL (ref 1.4–7.0)
Neutrophils: 68 %
Platelets: 325 10*3/uL (ref 150–450)
RBC: 4.36 x10E6/uL (ref 3.77–5.28)
RDW: 13.1 % (ref 11.7–15.4)
WBC: 7.9 10*3/uL (ref 3.4–10.8)

## 2019-06-17 LAB — URINALYSIS, ROUTINE W REFLEX MICROSCOPIC
Bilirubin, UA: NEGATIVE
Glucose, UA: NEGATIVE
Ketones, UA: NEGATIVE
Leukocytes,UA: NEGATIVE
Nitrite, UA: NEGATIVE
Protein,UA: NEGATIVE
RBC, UA: NEGATIVE
Specific Gravity, UA: 1.005 (ref 1.005–1.030)
Urobilinogen, Ur: 0.2 mg/dL (ref 0.2–1.0)
pH, UA: 7 (ref 5.0–7.5)

## 2019-06-17 LAB — COMPREHENSIVE METABOLIC PANEL
ALT: 11 IU/L (ref 0–32)
AST: 14 IU/L (ref 0–40)
Albumin/Globulin Ratio: 1.5 (ref 1.2–2.2)
Albumin: 4.3 g/dL (ref 3.8–4.8)
Alkaline Phosphatase: 78 IU/L (ref 39–117)
BUN/Creatinine Ratio: 10 (ref 9–23)
BUN: 8 mg/dL (ref 6–24)
Bilirubin Total: 0.3 mg/dL (ref 0.0–1.2)
CO2: 23 mmol/L (ref 20–29)
Calcium: 9.4 mg/dL (ref 8.7–10.2)
Chloride: 102 mmol/L (ref 96–106)
Creatinine, Ser: 0.8 mg/dL (ref 0.57–1.00)
GFR calc Af Amer: 103 mL/min/{1.73_m2} (ref >59)
GFR calc non Af Amer: 89 mL/min/{1.73_m2} (ref >59)
Globulin, Total: 2.8 g/dL (ref 1.5–4.5)
Glucose: 100 mg/dL — ABNORMAL HIGH (ref 65–99)
Potassium: 3.9 mmol/L (ref 3.5–5.2)
Sodium: 138 mmol/L (ref 134–144)
Total Protein: 7.1 g/dL (ref 6.0–8.5)

## 2019-06-17 LAB — URINE CULTURE

## 2019-10-17 IMAGING — CT CT RENAL STONE PROTOCOL
2 of 4 series · 16 of 46 positions shown, 18 images · non-contrast
Comparison: Abdominal radiograph dated 09/13/2007.

CLINICAL DATA: 41-year-old female with left lower quadrant
abdominal pain.

EXAM:
CT ABDOMEN AND PELVIS WITHOUT CONTRAST
TECHNIQUE: Multidetector CT imaging of the abdomen and pelvis was performed
following the standard protocol without IV contrast.

[Series 2: stone full standard · axial · 0.77mm/px · z∈[-1252,-837]mm · 13 of 91 slices shown, 15 images]
[im 4/91  soft-tissue]
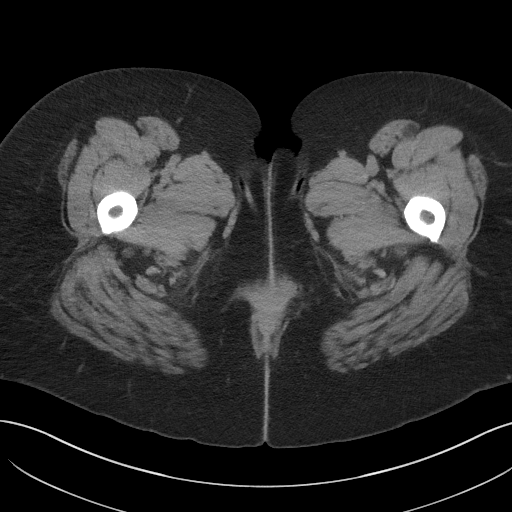
[im 4/91  bone]
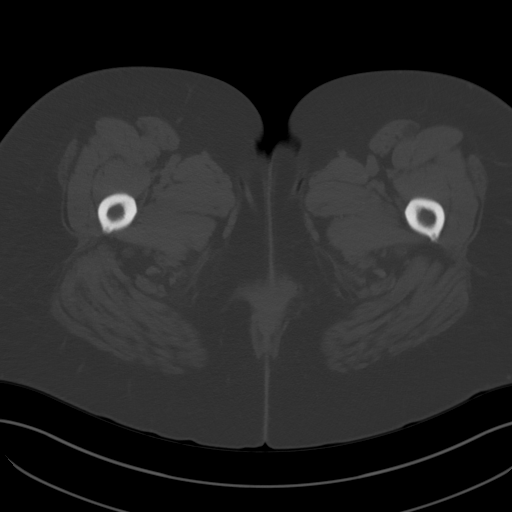
[im 12/91  soft-tissue]
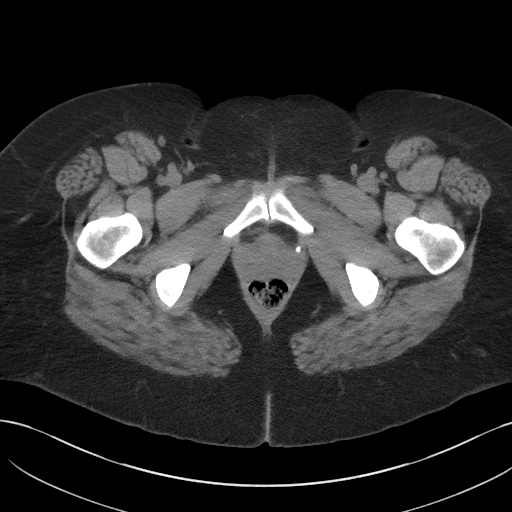
[im 20/91  soft-tissue]
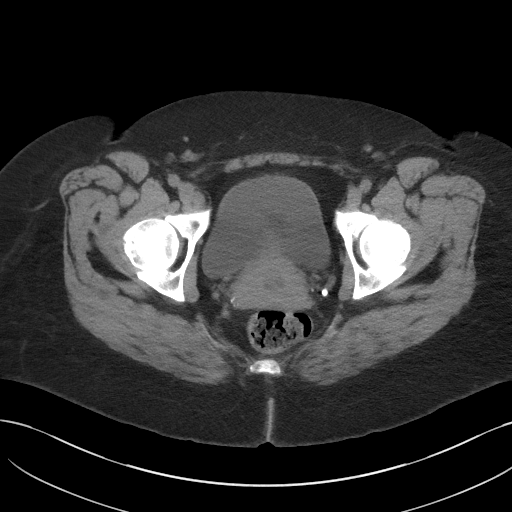
[im 24/91  soft-tissue]
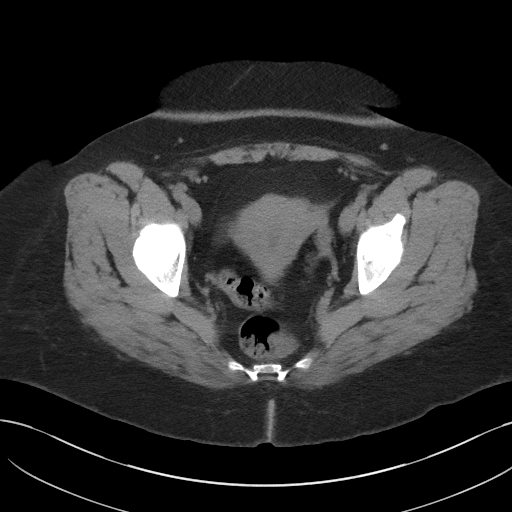
[im 32/91  soft-tissue]
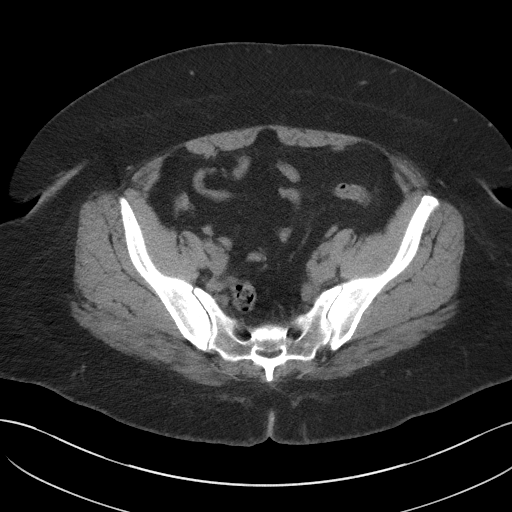
[im 40/91  soft-tissue]
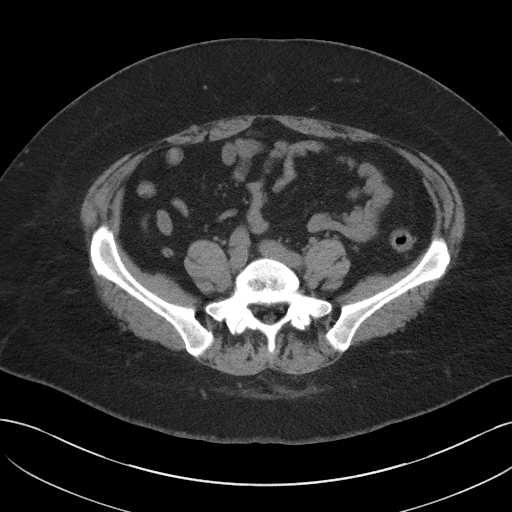
[im 47/91  soft-tissue]
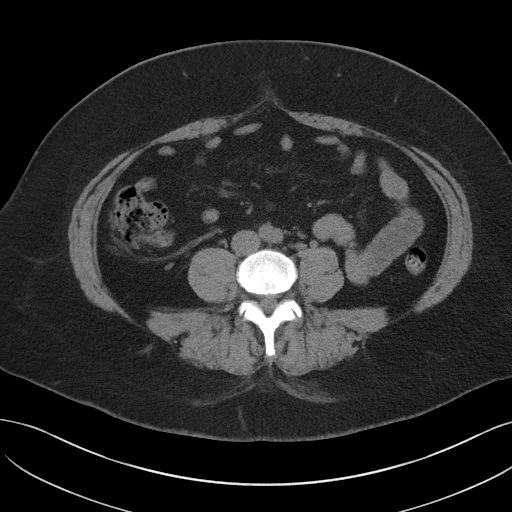
[im 51/91  soft-tissue]
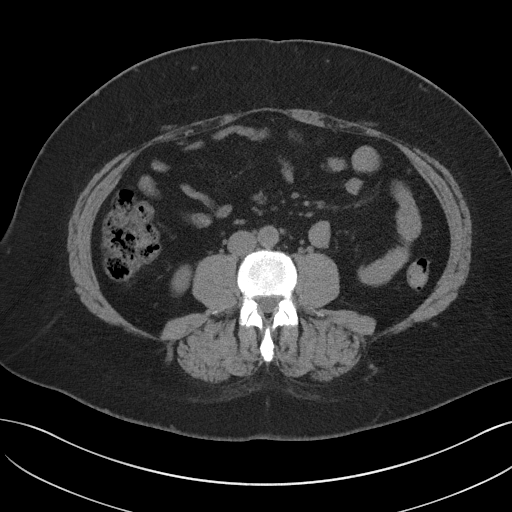
[im 59/91  soft-tissue]
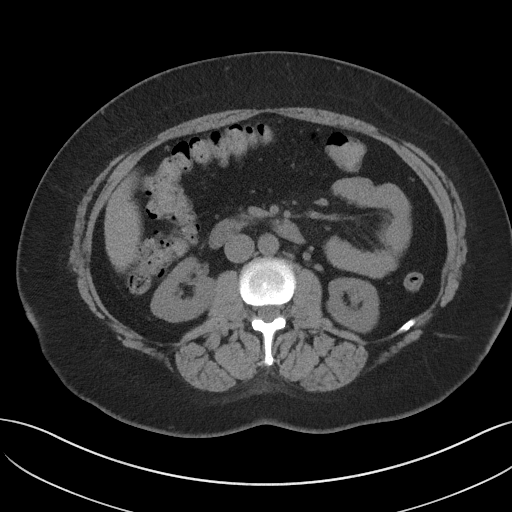
[im 59/91  bone]
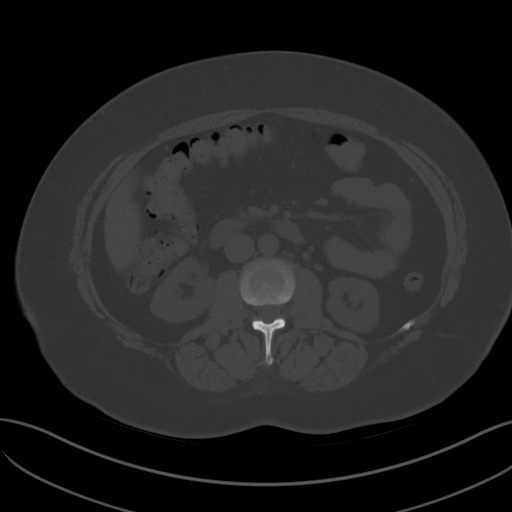
[im 67/91  soft-tissue]
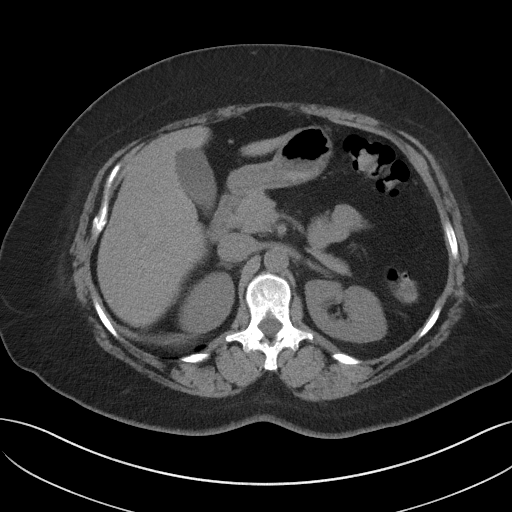
[im 71/91  soft-tissue]
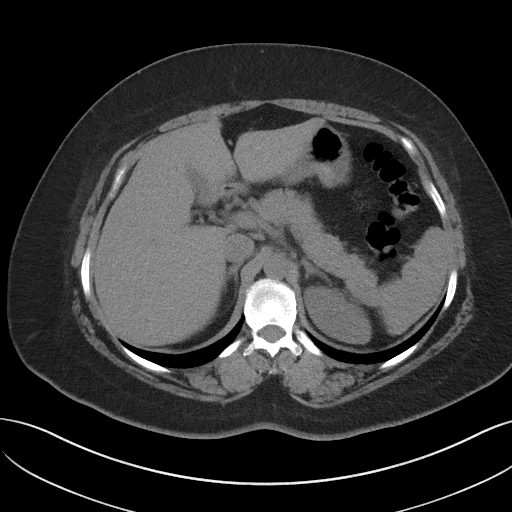
[im 79/91  soft-tissue]
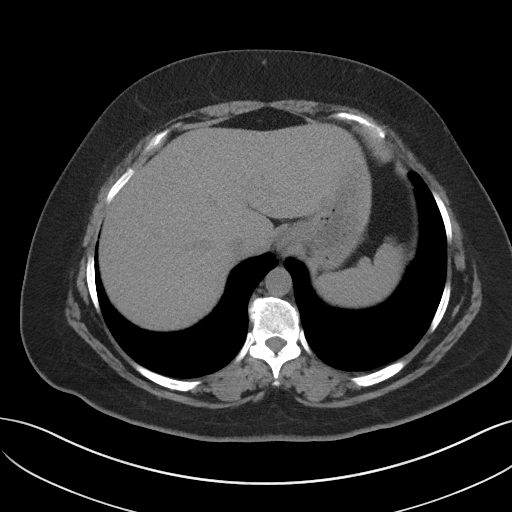
[im 87/91  soft-tissue]
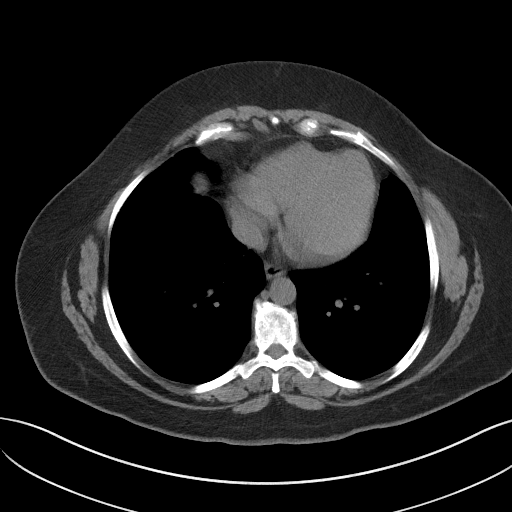

[Series 5: coronal · coronal · 0.84mm/px · 3 of 154 slices shown]
[im 52/154  soft-tissue]
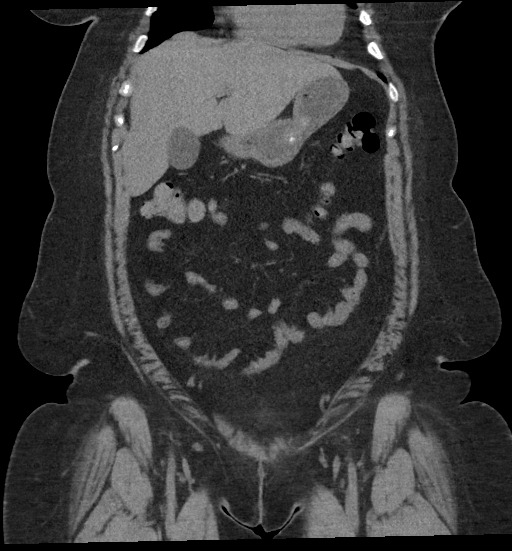
[im 69/154  soft-tissue]
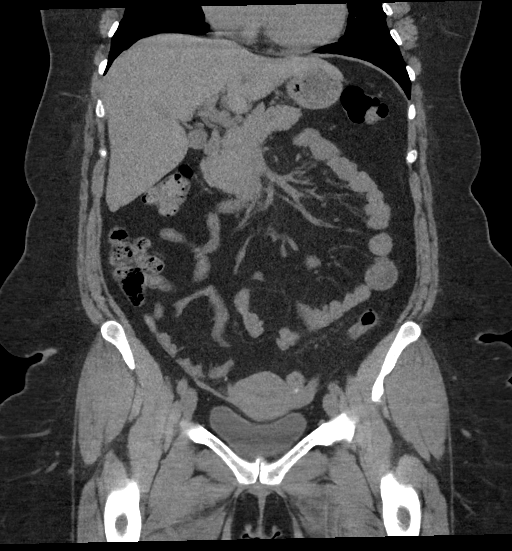
[im 86/154  soft-tissue]
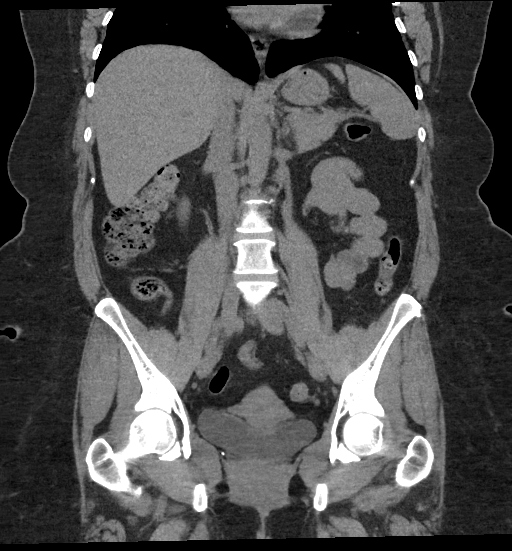

[16 of 46 positions shown; findings below may reference images not displayed]

FINDINGS: Evaluation of this exam is limited in the absence of intravenous
contrast.

Lower chest: The visualized lung bases are clear.

No intra-abdominal free air or free fluid.

Hepatobiliary: No focal liver abnormality is seen. No gallstones,
gallbladder wall thickening, or biliary dilatation.

Pancreas: Unremarkable. No pancreatic ductal dilatation or
surrounding inflammatory changes.

Spleen: Normal in size without focal abnormality.

Adrenals/Urinary Tract: Adrenal glands are unremarkable. Kidneys are
normal, without renal calculi, focal lesion, or hydronephrosis.
Bladder is unremarkable.

Stomach/Bowel: There are scattered sigmoid diverticula. There is
mild haziness and inflammatory changes surrounding a sigmoid
diverticula in the left hemipelvis (series 2, image 56) consistent
with acute diverticulitis. No diverticular abscess or perforation.
There is no bowel obstruction. Normal appendix.

Vascular/Lymphatic: The abdominal aorta and IVC are grossly
unremarkable on this noncontrast CT. No portal venous gas there is
no adenopathy.

Reproductive: The uterus is anteverted and grossly unremarkable. The
ovaries appear unremarkable as well. Bilateral tubal ligation clips
noted.

Other: None

Musculoskeletal: Degenerative changes of the lower thoracic spine.
Disc desiccation at L5-S1. No acute osseous pathology.
IMPRESSION: 1. Mild sigmoid diverticulitis. No diverticular abscess or
perforation.
2. No hydronephrosis or nephrolithiasis.

## 2019-12-15 ENCOUNTER — Ambulatory Visit: Payer: Self-pay | Admitting: Internal Medicine

## 2019-12-19 ENCOUNTER — Other Ambulatory Visit: Payer: Self-pay

## 2019-12-20 ENCOUNTER — Ambulatory Visit (INDEPENDENT_AMBULATORY_CARE_PROVIDER_SITE_OTHER): Payer: 59 | Admitting: Internal Medicine

## 2019-12-20 ENCOUNTER — Encounter: Payer: Self-pay | Admitting: Internal Medicine

## 2019-12-20 VITALS — BP 130/88 | HR 70 | Temp 97.4°F | Ht 63.31 in | Wt 219.0 lb

## 2019-12-20 DIAGNOSIS — G8929 Other chronic pain: Secondary | ICD-10-CM

## 2019-12-20 DIAGNOSIS — Z1329 Encounter for screening for other suspected endocrine disorder: Secondary | ICD-10-CM

## 2019-12-20 DIAGNOSIS — M25511 Pain in right shoulder: Secondary | ICD-10-CM

## 2019-12-20 DIAGNOSIS — R739 Hyperglycemia, unspecified: Secondary | ICD-10-CM

## 2019-12-20 DIAGNOSIS — M542 Cervicalgia: Secondary | ICD-10-CM

## 2019-12-20 DIAGNOSIS — B351 Tinea unguium: Secondary | ICD-10-CM

## 2019-12-20 DIAGNOSIS — Z1231 Encounter for screening mammogram for malignant neoplasm of breast: Secondary | ICD-10-CM | POA: Diagnosis not present

## 2019-12-20 DIAGNOSIS — R030 Elevated blood-pressure reading, without diagnosis of hypertension: Secondary | ICD-10-CM

## 2019-12-20 DIAGNOSIS — Z0000 Encounter for general adult medical examination without abnormal findings: Secondary | ICD-10-CM | POA: Diagnosis not present

## 2019-12-20 DIAGNOSIS — E785 Hyperlipidemia, unspecified: Secondary | ICD-10-CM

## 2019-12-20 DIAGNOSIS — E559 Vitamin D deficiency, unspecified: Secondary | ICD-10-CM

## 2019-12-20 MED ORDER — TERBINAFINE HCL 250 MG PO TABS: 250.0000 mg | ORAL_TABLET | Freq: Every day | ORAL | 0 refills | Status: DC

## 2019-12-20 NOTE — Patient Instructions (Addendum)
Heat  Tylenol  Monitor blood pressure goal <130/<80 voltaren gel  Lidocaine pain patch    miralax laxative for constipation  OR Senna/colace for constipation    Cyclobenzaprine tablets What is this medicine? CYCLOBENZAPRINE (sye kloe BEN za preen) is a muscle relaxer. It is used to treat muscle pain, spasms, and stiffness. This medicine may be used for other purposes; ask your health care provider or pharmacist if you have questions. COMMON BRAND NAME(S): Fexmid, Flexeril What should I tell my health care provider before I take this medicine? They need to know if you have any of these conditions:  heart disease, irregular heartbeat, or previous heart attack  liver disease  thyroid problem  an unusual or allergic reaction to cyclobenzaprine, tricyclic antidepressants, lactose, other medicines, foods, dyes, or preservatives  pregnant or trying to get pregnant  breast-feeding How should I use this medicine? Take this medicine by mouth with a glass of water. Follow the directions on the prescription label. If this medicine upsets your stomach, take it with food or milk. Take your medicine at regular intervals. Do not take it more often than directed. Talk to your pediatrician regarding the use of this medicine in children. Special care may be needed. Overdosage: If you think you have taken too much of this medicine contact a poison control center or emergency room at once. NOTE: This medicine is only for you. Do not share this medicine with others. What if I miss a dose? If you miss a dose, take it as soon as you can. If it is almost time for your next dose, take only that dose. Do not take double or extra doses. What may interact with this medicine? Do not take this medicine with any of the following medications:  MAOIs like Carbex, Eldepryl, Marplan, Nardil, and Parnate  narcotic medicines for cough  safinamide This medicine may also interact with the following  medications:  alcohol  bupropion  antihistamines for allergy, cough and cold  certain medicines for anxiety or sleep  certain medicines for bladder problems like oxybutynin, tolterodine  certain medicines for depression like amitriptyline, fluoxetine, sertraline  certain medicines for Parkinson's disease like benztropine, trihexyphenidyl  certain medicines for seizures like phenobarbital, primidone  certain medicines for stomach problems like dicyclomine, hyoscyamine  certain medicines for travel sickness like scopolamine  general anesthetics like halothane, isoflurane, methoxyflurane, propofol  ipratropium  local anesthetics like lidocaine, pramoxine, tetracaine  medicines that relax muscles for surgery  narcotic medicines for pain  phenothiazines like chlorpromazine, mesoridazine, prochlorperazine, thioridazine  verapamil This list may not describe all possible interactions. Give your health care provider a list of all the medicines, herbs, non-prescription drugs, or dietary supplements you use. Also tell them if you smoke, drink alcohol, or use illegal drugs. Some items may interact with your medicine. What should I watch for while using this medicine? Tell your doctor or health care professional if your symptoms do not start to get better or if they get worse. You may get drowsy or dizzy. Do not drive, use machinery, or do anything that needs mental alertness until you know how this medicine affects you. Do not stand or sit up quickly, especially if you are an older patient. This reduces the risk of dizzy or fainting spells. Alcohol may interfere with the effect of this medicine. Avoid alcoholic drinks. If you are taking another medicine that also causes drowsiness, you may have more side effects. Give your health care provider a list of all medicines you  use. Your doctor will tell you how much medicine to take. Do not take more medicine than directed. Call emergency for  help if you have problems breathing or unusual sleepiness. Your mouth may get dry. Chewing sugarless gum or sucking hard candy, and drinking plenty of water may help. Contact your doctor if the problem does not go away or is severe. What side effects may I notice from receiving this medicine? Side effects that you should report to your doctor or health care professional as soon as possible:  allergic reactions like skin rash, itching or hives, swelling of the face, lips, or tongue  breathing problems  chest pain  fast, irregular heartbeat  hallucinations  seizures  unusually weak or tired Side effects that usually do not require medical attention (report to your doctor or health care professional if they continue or are bothersome):  headache  nausea, vomiting This list may not describe all possible side effects. Call your doctor for medical advice about side effects. You may report side effects to FDA at 1-800-FDA-1088. Where should I keep my medicine? Keep out of the reach of children. Store at room temperature between 15 and 30 degrees C (59 and 86 degrees F). Keep container tightly closed. Throw away any unused medicine after the expiration date. NOTE: This sheet is a summary. It may not cover all possible information. If you have questions about this medicine, talk to your doctor, pharmacist, or health care provider.  2020 Elsevier/Gold Standard (2018-05-26 12:49:26)  DASH Eating Plan DASH stands for "Dietary Approaches to Stop Hypertension." The DASH eating plan is a healthy eating plan that has been shown to reduce high blood pressure (hypertension). It may also reduce your risk for type 2 diabetes, heart disease, and stroke. The DASH eating plan may also help with weight loss. What are tips for following this plan?  General guidelines  Avoid eating more than 2,300 mg (milligrams) of salt (sodium) a day. If you have hypertension, you may need to reduce your sodium intake  to 1,500 mg a day.  Limit alcohol intake to no more than 1 drink a day for nonpregnant women and 2 drinks a day for men. One drink equals 12 oz of beer, 5 oz of wine, or 1 oz of hard liquor.  Work with your health care provider to maintain a healthy body weight or to lose weight. Ask what an ideal weight is for you.  Get at least 30 minutes of exercise that causes your heart to beat faster (aerobic exercise) most days of the week. Activities may include walking, swimming, or biking.  Work with your health care provider or diet and nutrition specialist (dietitian) to adjust your eating plan to your individual calorie needs. Reading food labels   Check food labels for the amount of sodium per serving. Choose foods with less than 5 percent of the Daily Value of sodium. Generally, foods with less than 300 mg of sodium per serving fit into this eating plan.  To find whole grains, look for the word "whole" as the first word in the ingredient list. Shopping  Buy products labeled as "low-sodium" or "no salt added."  Buy fresh foods. Avoid canned foods and premade or frozen meals. Cooking  Avoid adding salt when cooking. Use salt-free seasonings or herbs instead of table salt or sea salt. Check with your health care provider or pharmacist before using salt substitutes.  Do not fry foods. Cook foods using healthy methods such as baking, boiling, grilling, and  broiling instead.  Cook with heart-healthy oils, such as olive, canola, soybean, or sunflower oil. Meal planning  Eat a balanced diet that includes: ? 5 or more servings of fruits and vegetables each day. At each meal, try to fill half of your plate with fruits and vegetables. ? Up to 6-8 servings of whole grains each day. ? Less than 6 oz of lean meat, poultry, or fish each day. A 3-oz serving of meat is about the same size as a deck of cards. One egg equals 1 oz. ? 2 servings of low-fat dairy each day. ? A serving of nuts, seeds, or  beans 5 times each week. ? Heart-healthy fats. Healthy fats called Omega-3 fatty acids are found in foods such as flaxseeds and coldwater fish, like sardines, salmon, and mackerel.  Limit how much you eat of the following: ? Canned or prepackaged foods. ? Food that is high in trans fat, such as fried foods. ? Food that is high in saturated fat, such as fatty meat. ? Sweets, desserts, sugary drinks, and other foods with added sugar. ? Full-fat dairy products.  Do not salt foods before eating.  Try to eat at least 2 vegetarian meals each week.  Eat more home-cooked food and less restaurant, buffet, and fast food.  When eating at a restaurant, ask that your food be prepared with less salt or no salt, if possible. What foods are recommended? The items listed may not be a complete list. Talk with your dietitian about what dietary choices are best for you. Grains Whole-grain or whole-wheat bread. Whole-grain or whole-wheat pasta. Brown rice. Orpah Cobb. Bulgur. Whole-grain and low-sodium cereals. Pita bread. Low-fat, low-sodium crackers. Whole-wheat flour tortillas. Vegetables Fresh or frozen vegetables (raw, steamed, roasted, or grilled). Low-sodium or reduced-sodium tomato and vegetable juice. Low-sodium or reduced-sodium tomato sauce and tomato paste. Low-sodium or reduced-sodium canned vegetables. Fruits All fresh, dried, or frozen fruit. Canned fruit in natural juice (without added sugar). Meat and other protein foods Skinless chicken or [place]. Ground chicken or [place]. Pork with fat trimmed off. Fish and seafood. Egg whites. Dried beans, peas, or lentils. Unsalted nuts, nut butters, and seeds. Unsalted canned beans. Lean cuts of beef with fat trimmed off. Low-sodium, lean deli meat. Dairy Low-fat (1%) or fat-free (skim) milk. Fat-free, low-fat, or reduced-fat cheeses. Nonfat, low-sodium ricotta or cottage cheese. Low-fat or nonfat yogurt. Low-fat, low-sodium cheese. Fats and  oils Soft margarine without trans fats. Vegetable oil. Low-fat, reduced-fat, or light mayonnaise and salad dressings (reduced-sodium). Canola, safflower, olive, soybean, and sunflower oils. Avocado. Seasoning and other foods Herbs. Spices. Seasoning mixes without salt. Unsalted popcorn and pretzels. Fat-free sweets. What foods are not recommended? The items listed may not be a complete list. Talk with your dietitian about what dietary choices are best for you. Grains Baked goods made with fat, such as croissants, muffins, or some breads. Dry pasta or rice meal packs. Vegetables Creamed or fried vegetables. Vegetables in a cheese sauce. Regular canned vegetables (not low-sodium or reduced-sodium). Regular canned tomato sauce and paste (not low-sodium or reduced-sodium). Regular tomato and vegetable juice (not low-sodium or reduced-sodium). Rosita Fire. Olives. Fruits Canned fruit in a light or heavy syrup. Fried fruit. Fruit in cream or butter sauce. Meat and other protein foods Fatty cuts of meat. Ribs. Fried meat. Tomasa Blase. Sausage. Bologna and other processed lunch meats. Salami. Fatback. Hotdogs. Bratwurst. Salted nuts and seeds. Canned beans with added salt. Canned or smoked fish. Whole eggs or egg yolks. Chicken or [place] with  skin. Dairy Whole or 2% milk, cream, and half-and-half. Whole or full-fat cream cheese. Whole-fat or sweetened yogurt. Full-fat cheese. Nondairy creamers. Whipped toppings. Processed cheese and cheese spreads. Fats and oils Butter. Stick margarine. Lard. Shortening. Ghee. Bacon fat. Tropical oils, such as coconut, palm kernel, or palm oil. Seasoning and other foods Salted popcorn and pretzels. Onion salt, garlic salt, seasoned salt, table salt, and sea salt. Worcestershire sauce. Tartar sauce. Barbecue sauce. Teriyaki sauce. Soy sauce, including reduced-sodium. Steak sauce. Canned and packaged gravies. Fish sauce. Oyster sauce. Cocktail sauce. Horseradish that you find on the  shelf. Ketchup. Mustard. Meat flavorings and tenderizers. Bouillon cubes. Hot sauce and Tabasco sauce. Premade or packaged marinades. Premade or packaged taco seasonings. Relishes. Regular salad dressings. Where to find more information:  National Heart, Lung, and [place]: https://wilson-eaton.com/  American Heart Association: www.heart.org Summary  The DASH eating plan is a healthy eating plan that has been shown to reduce high blood pressure (hypertension). It may also reduce your risk for type 2 diabetes, heart disease, and stroke.  With the DASH eating plan, you should limit salt (sodium) intake to 2,300 mg a day. If you have hypertension, you may need to reduce your sodium intake to 1,500 mg a day.  When on the DASH eating plan, aim to eat more fresh fruits and vegetables, whole grains, lean proteins, low-fat dairy, and heart-healthy fats.  Work with your health care provider or diet and nutrition specialist (dietitian) to adjust your eating plan to your individual calorie needs. This information is not intended to replace advice given to you by your health care provider. Make sure you discuss any questions you have with your health care provider. Document Revised: 06/05/2017 Document Reviewed: 06/16/2016 Elsevier Patient Education  [place].   Shoulder Exercises Ask your health care provider which exercises are safe for you. Do exercises exactly as told by your health care provider and adjust them as directed. It is normal to feel mild stretching, pulling, tightness, or discomfort as you do these exercises. Stop right away if you feel sudden pain or your pain gets worse. Do not begin these exercises until told by your health care provider. Stretching exercises External rotation and abduction This exercise is sometimes called corner stretch. This exercise rotates your arm outward (external rotation) and moves your arm out from your body (abduction). 1. Stand in a doorway with  one of your feet slightly in front of the other. This is called a staggered stance. If you cannot reach your forearms to the door frame, stand facing a corner of a room. 2. Choose one of the following positions as told by your health care provider: ? Place your hands and forearms on the door frame above your head. ? Place your hands and forearms on the door frame at the height of your head. ? Place your hands on the door frame at the height of your elbows. 3. Slowly move your weight onto your front foot until you feel a stretch across your chest and in the front of your shoulders. Keep your head and chest upright and keep your abdominal muscles tight. 4. Hold for __________ seconds. 5. To release the stretch, shift your weight to your back foot. Repeat __________ times. Complete this exercise __________ times a day. Extension, standing 1. Stand and hold a broomstick, a cane, or a similar object behind your back. ? Your hands should be a little wider than shoulder width apart. ? Your palms should face away from  your back. 2. Keeping your elbows straight and your shoulder muscles relaxed, move the stick away from your body until you feel a stretch in your shoulders (extension). ? Avoid shrugging your shoulders while you move the stick. Keep your shoulder blades tucked down toward the middle of your back. 3. Hold for __________ seconds. 4. Slowly return to the starting position. Repeat __________ times. Complete this exercise __________ times a day. Range-of-motion exercises Pendulum  1. Stand near a wall or a surface that you can hold onto for balance. 2. Bend at the waist and let your left / right arm hang straight down. Use your other arm to support you. Keep your back straight and do not lock your knees. 3. Relax your left / right arm and shoulder muscles, and move your hips and your trunk so your left / right arm swings freely. Your arm should swing because of the motion of your body, not  because you are using your arm or shoulder muscles. 4. Keep moving your hips and trunk so your arm swings in the following directions, as told by your health care provider: ? Side to side. ? Forward and backward. ? In clockwise and counterclockwise circles. 5. Continue each motion for __________ seconds, or for as long as told by your health care provider. 6. Slowly return to the starting position. Repeat __________ times. Complete this exercise __________ times a day. Shoulder flexion, standing  1. Stand and hold a broomstick, a cane, or a similar object. Place your hands a little more than shoulder width apart on the object. Your left / right hand should be palm up, and your other hand should be palm down. 2. Keep your elbow straight and your shoulder muscles relaxed. Push the stick up with your healthy arm to raise your left / right arm in front of your body, and then over your head until you feel a stretch in your shoulder (flexion). ? Avoid shrugging your shoulder while you raise your arm. Keep your shoulder blade tucked down toward the middle of your back. 3. Hold for __________ seconds. 4. Slowly return to the starting position. Repeat __________ times. Complete this exercise __________ times a day. Shoulder abduction, standing 1. Stand and hold a broomstick, a cane, or a similar object. Place your hands a little more than shoulder width apart on the object. Your left / right hand should be palm up, and your other hand should be palm down. 2. Keep your elbow straight and your shoulder muscles relaxed. Push the object across your body toward your left / right side. Raise your left / right arm to the side of your body (abduction) until you feel a stretch in your shoulder. ? Do not raise your arm above shoulder height unless your health care provider tells you to do that. ? If directed, raise your arm over your head. ? Avoid shrugging your shoulder while you raise your arm. Keep your  shoulder blade tucked down toward the middle of your back. 3. Hold for __________ seconds. 4. Slowly return to the starting position. Repeat __________ times. Complete this exercise __________ times a day. Internal rotation  1. Place your left / right hand behind your back, palm up. 2. Use your other hand to dangle an exercise band, a towel, or a similar object over your shoulder. Grasp the band with your left / right hand so you are holding on to both ends. 3. Gently pull up on the band until you feel a stretch in  the front of your left / right shoulder. The movement of your arm toward the center of your body is called internal rotation. ? Avoid shrugging your shoulder while you raise your arm. Keep your shoulder blade tucked down toward the middle of your back. 4. Hold for __________ seconds. 5. Release the stretch by letting go of the band and lowering your hands. Repeat __________ times. Complete this exercise __________ times a day. Strengthening exercises External rotation  1. Sit in a stable chair without armrests. 2. Secure an exercise band to a stable object at elbow height on your left / right side. 3. Place a soft object, such as a folded towel or a small pillow, between your left / right upper arm and your body to move your elbow about 4 inches (10 cm) away from your side. 4. Hold the end of the exercise band so it is tight and there is no slack. 5. Keeping your elbow pressed against the soft object, slowly move your forearm out, away from your abdomen (external rotation). Keep your body steady so only your forearm moves. 6. Hold for __________ seconds. 7. Slowly return to the starting position. Repeat __________ times. Complete this exercise __________ times a day. Shoulder abduction  1. Sit in a stable chair without armrests, or stand up. 2. Hold a __________ weight in your left / right hand, or hold an exercise band with both hands. 3. Start with your arms straight down and  your left / right palm facing in, toward your body. 4. Slowly lift your left / right hand out to your side (abduction). Do not lift your hand above shoulder height unless your health care provider tells you that this is safe. ? Keep your arms straight. ? Avoid shrugging your shoulder while you do this movement. Keep your shoulder blade tucked down toward the middle of your back. 5. Hold for __________ seconds. 6. Slowly lower your arm, and return to the starting position. Repeat __________ times. Complete this exercise __________ times a day. Shoulder extension 1. Sit in a stable chair without armrests, or stand up. 2. Secure an exercise band to a stable object in front of you so it is at shoulder height. 3. Hold one end of the exercise band in each hand. Your palms should face each other. 4. Straighten your elbows and lift your hands up to shoulder height. 5. Step back, away from the secured end of the exercise band, until the band is tight and there is no slack. 6. Squeeze your shoulder blades together as you pull your hands down to the sides of your thighs (extension). Stop when your hands are straight down by your sides. Do not let your hands go behind your body. 7. Hold for __________ seconds. 8. Slowly return to the starting position. Repeat __________ times. Complete this exercise __________ times a day. Shoulder row 1. Sit in a stable chair without armrests, or stand up. 2. Secure an exercise band to a stable object in front of you so it is at waist height. 3. Hold one end of the exercise band in each hand. Position your palms so that your thumbs are facing the ceiling (neutral position). 4. Bend each of your elbows to a 90-degree angle (right angle) and keep your upper arms at your sides. 5. Step back until the band is tight and there is no slack. 6. Slowly pull your elbows back behind you. 7. Hold for __________ seconds. 8. Slowly return to the starting position. Repeat  __________  times. Complete this exercise __________ times a day. Shoulder press-ups  1. Sit in a stable chair that has armrests. Sit upright, with your feet flat on the floor. 2. Put your hands on the armrests so your elbows are bent and your fingers are pointing forward. Your hands should be about even with the sides of your body. 3. Push down on the armrests and use your arms to lift yourself off the chair. Straighten your elbows and lift yourself up as much as you comfortably can. ? Move your shoulder blades down, and avoid letting your shoulders move up toward your ears. ? Keep your feet on the ground. As you get stronger, your feet should support less of your body weight as you lift yourself up. 4. Hold for __________ seconds. 5. Slowly lower yourself back into the chair. Repeat __________ times. Complete this exercise __________ times a day. Wall push-ups  1. Stand so you are facing a stable wall. Your feet should be about one arm-length away from the wall. 2. Lean forward and place your palms on the wall at shoulder height. 3. Keep your feet flat on the floor as you bend your elbows and lean forward toward the wall. 4. Hold for __________ seconds. 5. Straighten your elbows to push yourself back to the starting position. Repeat __________ times. Complete this exercise __________ times a day. This information is not intended to replace advice given to you by your health care provider. Make sure you discuss any questions you have with your health care provider. Document Revised: 10/15/2018 Document Reviewed: 07/23/2018 Elsevier Patient Education  2020 [employer].  Neck Exercises Ask your health care provider which exercises are safe for you. Do exercises exactly as told by your health care provider and adjust them as directed. It is normal to feel mild stretching, pulling, tightness, or discomfort as you do these exercises. Stop right away if you feel sudden pain or your pain gets worse. Do not  begin these exercises until told by your health care provider. Neck exercises can be important for many reasons. They can improve strength and maintain flexibility in your neck, which will help your upper back and prevent neck pain. Stretching exercises Rotation neck stretching  1. Sit in a chair or stand up. 2. Place your feet flat on the floor, shoulder width apart. 3. Slowly turn your head (rotate) to the right until a slight stretch is felt. Turn it all the way to the right so you can look over your right shoulder. Do not tilt or tip your head. 4. Hold this position for 10-30 seconds. 5. Slowly turn your head (rotate) to the left until a slight stretch is felt. Turn it all the way to the left so you can look over your left shoulder. Do not tilt or tip your head. 6. Hold this position for 10-30 seconds. Repeat __________ times. Complete this exercise __________ times a day. Neck retraction 1. Sit in a sturdy chair or stand up. 2. Look straight ahead. Do not bend your neck. 3. Use your fingers to push your chin backward (retraction). Do not bend your neck for this movement. Continue to face straight ahead. If you are doing the exercise properly, you will feel a slight sensation in your throat and a stretch at the back of your neck. 4. Hold the stretch for 1-2 seconds. Repeat __________ times. Complete this exercise __________ times a day. Strengthening exercises Neck press 1. Lie on your back on a firm bed  or on the floor with a pillow under your head. 2. Use your neck muscles to push your head down on the pillow and straighten your spine. 3. Hold the position as well as you can. Keep your head facing up (in a neutral position) and your chin tucked. 4. Slowly count to 5 while holding this position. Repeat __________ times. Complete this exercise __________ times a day. Isometrics These are exercises in which you strengthen the muscles in your neck while keeping your neck still  (isometrics). 1. Sit in a supportive chair and place your hand on your forehead. 2. Keep your head and face facing straight ahead. Do not flex or extend your neck while doing isometrics. 3. Push forward with your head and neck while pushing back with your hand. Hold for 10 seconds. 4. Do the sequence again, this time putting your hand against the back of your head. Use your head and neck to push backward against the hand pressure. 5. Finally, do the same exercise on either side of your head, pushing sideways against the pressure of your hand. Repeat __________ times. Complete this exercise __________ times a day. Prone head lifts 1. Lie face-down (prone position), resting on your elbows so that your chest and upper back are raised. 2. Start with your head facing downward, near your chest. Position your chin either on or near your chest. 3. Slowly lift your head upward. Lift until you are looking straight ahead. Then continue lifting your head as far back as you can comfortably stretch. 4. Hold your head up for 5 seconds. Then slowly lower it to your starting position. Repeat __________ times. Complete this exercise __________ times a day. Supine head lifts 1. Lie on your back (supine position), bending your knees to point to the ceiling and keeping your feet flat on the floor. 2. Lift your head slowly off the floor, raising your chin toward your chest. 3. Hold for 5 seconds. Repeat __________ times. Complete this exercise __________ times a day. Scapular retraction 1. Stand with your arms at your sides. Look straight ahead. 2. Slowly pull both shoulders (scapulae) backward and downward (retraction) until you feel a stretch between your shoulder blades in your upper back. 3. Hold for 10-30 seconds. 4. Relax and repeat. Repeat __________ times. Complete this exercise __________ times a day. Contact a health care provider if:  Your neck pain or discomfort gets much worse when you do an  exercise.  Your neck pain or discomfort does not improve within 2 hours after you exercise. If you have any of these problems, stop exercising right away. Do not do the exercises again unless your health care provider says that you can. Get help right away if:  You develop sudden, severe neck pain. If this happens, stop exercising right away. Do not do the exercises again unless your health care provider says that you can. This information is not intended to replace advice given to you by your health care provider. Make sure you discuss any questions you have with your health care provider. Document Revised: 04/21/2018 Document Reviewed: 04/21/2018 Elsevier Patient Education  2020 [employer].

## 2019-12-20 NOTE — Progress Notes (Signed)
Chief Complaint  Patient presents with  . Annual Exam  . Neck Pain    Patient has had imaging and will see physical therapy  . Back Pain  . Shoulder Pain   Annual  1. Neck pain radiating to right shoulder with muscle spasm on flexeril 5 mg qhs prn recently on steroids for this and f/u with ortho pain 4/10. 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 Lying flat helps pain  2. Toenail fungus wants to get back ton lamisil  3. BP slightly elevated today she does report Sunday seeing stars and dizzy/lightheaded like she was going to pass out she had skipped a meal this happened 12/18/19 and 2nd time felt like this  Review of Systems  Constitutional: Negative for weight loss.  HENT: Negative for hearing loss.   Eyes: Negative for blurred vision.  Respiratory: Negative for shortness of breath.   Cardiovascular: Negative for chest pain.  Gastrointestinal: Negative for abdominal pain.  Musculoskeletal: Positive for joint pain and neck pain.  Skin: Negative for rash.  Neurological: Negative for dizziness and headaches.  Psychiatric/Behavioral: Negative for depression.   Past Medical History:  Diagnosis Date  . Allergy   . Arthritis    right>left knee pt helped and dry needling   . Arthritis    right index PIP nodule   . Depression   . Diverticulitis   . Migraine    Past Surgical History:  Procedure Laterality Date  . COLONOSCOPY WITH PROPOFOL N/A 09/23/2017   Procedure: COLONOSCOPY WITH PROPOFOL;  Surgeon: Wyline Mood, MD;  Location: [facility] ENDOSCOPY;  Service: Gastroenterology;  Laterality: N/A;  . TUBAL LIGATION     02/1997   Family History  Problem Relation Age of Onset  . Cancer Mother        skin  . Hearing loss Mother   . Heart disease Mother   . Hypertension Mother   . Cancer Father        esophageal smoker   . Early death Father   . Cancer Maternal  Grandmother        skin  . Diabetes Maternal Grandfather   . Heart disease Maternal Grandfather   . Hypertension Maternal Grandfather   . Cancer Paternal Grandmother        skin  . Arthritis Paternal Grandfather   . Cancer Paternal Grandfather        leukemia  . Hearing loss Paternal Grandfather    Social History   Socioeconomic History  . Marital status: Married    Spouse name: Not on file  . Number of children: Not on file  . Years of education: Not on file  . Highest education level: Not on file  Occupational History  . Not on file  Tobacco Use  . Smoking status: Former Games developer  . Smokeless tobacco: Never Used  . Tobacco comment: former smoker quit 1 week ago 08/2017 since age 71 y.o   Vaping Use  . Vaping Use: Never used  Substance and Sexual Activity  . Alcohol use: Yes  . Drug use: No  . Sexual activity: Yes    Partners: Female  Other Topics Concern  . 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 Difficulty of Paying Living Expenses:   Food Insecurity:   . Worried About Charity fundraiser in the Last Year:   . Arboriculturist in the Last Year:   Transportation Needs:   . Film/video editor (Medical):   Marland Kitchen Lack of Transportation (Non-Medical):   Physical Activity:   . Days of Exercise per Week:   . Minutes of Exercise per Session:   Stress:   . Feeling of Stress :   Social Connections:   . Frequency of Communication with Friends and Family:   . Frequency of Social Gatherings with Friends and Family:   . Attends Religious Services:   . Active Member of Clubs or Organizations:   . Attends Archivist Meetings:   Marland Kitchen Marital Status:   Intimate Partner Violence:   . Fear  of Current or Ex-Partner:   . Emotionally Abused:   Marland Kitchen Physically Abused:   . Sexually Abused:    Current Meds  Medication Sig  . cyclobenzaprine (FLEXERIL) 5 MG tablet Take 5 mg by mouth 3 (three) times daily as needed for muscle spasms.  . methocarbamol (ROBAXIN) 750 MG tablet Take 750 mg by mouth 4 (four) times daily.   Allergies  Allergen Reactions  . Penicillins Rash   No results found for this or any previous visit (from the past 2160 hour(s)). Objective  Body mass index is 38.42 kg/m. Wt Readings from Last 3 Encounters:  12/20/19 219 lb (99.3 kg)  09/29/17 220 lb (99.8 kg)  09/23/17 220 lb (99.8 kg)   Temp Readings from Last 3 Encounters:  12/20/19 (!) 97.4 F (36.3 C) (Temporal)  09/29/17 98.2 F (36.8 C) (Oral)  09/23/17 (!) 97 F (36.1 C) (Tympanic)   BP Readings from Last 3 Encounters:  12/20/19 130/88  09/29/17 120/80  09/23/17 134/78   Pulse Readings from Last 3 Encounters:  12/20/19 70  09/29/17 (!) 57  09/23/17 73    Physical Exam Vitals and nursing note reviewed.  Constitutional:      Appearance: Normal appearance. She is well-developed and well-groomed.  HENT:     Head: Normocephalic and atraumatic.  Eyes:     Conjunctiva/sclera: Conjunctivae normal.     Pupils: Pupils are equal, round, and reactive to light.  Cardiovascular:     Rate and Rhythm: Normal rate and regular rhythm.     Heart sounds: Normal heart sounds. No murmur heard.   Pulmonary:     Effort: Pulmonary effort is normal.     Breath sounds: Normal breath sounds.  Chest:     Breasts: Breasts are symmetrical.        Right: Normal.        Left: Normal.  Lymphadenopathy:     Upper Body:     Right upper body: No axillary adenopathy.     Left upper body: No axillary adenopathy.  Skin:    General: Skin is warm and dry.  Neurological:     General: No focal deficit present.     Mental Status: She is alert and oriented to person, place, and time. Mental status is at baseline.      Gait: Gait normal.  Psychiatric:        Attention and Perception: Attention and perception normal.  Mood and Affect: Mood and affect normal.        Speech: Speech normal.        Behavior: Behavior normal. Behavior is cooperative.        Thought Content: Thought content normal.        Cognition and Memory: Cognition and memory normal.        Judgment: Judgment normal.     Assessment  Plan  Annual physical exam Declines flu shot  Declines covid vaccine Tdap had 2012/2013 Reviewed labs rec hep B vaccine given info for review  Disc [facility] $99 mammogram referred today  Pap3/26/19 negative except yeast  09/23/17 colonoscopy normal repeat rec in 10 years given copy today Last eye exam 2018  rec healthy diet and exercise   Also sees [facility] in [place] Dr. [name] since middle school ?macular degeneration mom has spots too  Dentist Dr. [name]  Onychomycosis - Plan: terbinafine (LAMISIL) 250 MG tablet lfts in 3 months   Elevated blood pressure reading - Plan: Comprehensive metabolic panel, Lipid panel, CBC with Differential/Platelet Monitor BP  Hyperlipidemia, unspecified hyperlipidemia type - Plan: Lipid panel  Vitamin D deficiency - Plan: VITAMIN D 25 Hydroxy (Vit-D Deficiency, Fractures)  Cervicalgia Chronic right shoulder pain  Prn Flexeril  F/u [facility]        Provider: Dr. French Ana McLean-Scocuzza-Internal Medicine

## 2019-12-22 ENCOUNTER — Telehealth: Payer: Self-pay | Admitting: *Deleted

## 2019-12-22 NOTE — Telephone Encounter (Signed)
Please place future orders for lab appt.  

## 2019-12-23 ENCOUNTER — Encounter: Payer: Self-pay | Admitting: Internal Medicine

## 2019-12-23 ENCOUNTER — Other Ambulatory Visit (INDEPENDENT_AMBULATORY_CARE_PROVIDER_SITE_OTHER): Payer: 59

## 2019-12-23 ENCOUNTER — Other Ambulatory Visit: Payer: Self-pay

## 2019-12-23 DIAGNOSIS — E559 Vitamin D deficiency, unspecified: Secondary | ICD-10-CM | POA: Diagnosis not present

## 2019-12-23 DIAGNOSIS — M542 Cervicalgia: Secondary | ICD-10-CM | POA: Insufficient documentation

## 2019-12-23 DIAGNOSIS — R739 Hyperglycemia, unspecified: Secondary | ICD-10-CM

## 2019-12-23 DIAGNOSIS — B351 Tinea unguium: Secondary | ICD-10-CM | POA: Insufficient documentation

## 2019-12-23 DIAGNOSIS — R030 Elevated blood-pressure reading, without diagnosis of hypertension: Secondary | ICD-10-CM

## 2019-12-23 DIAGNOSIS — Z1329 Encounter for screening for other suspected endocrine disorder: Secondary | ICD-10-CM

## 2019-12-23 DIAGNOSIS — G8929 Other chronic pain: Secondary | ICD-10-CM | POA: Insufficient documentation

## 2019-12-23 DIAGNOSIS — E785 Hyperlipidemia, unspecified: Secondary | ICD-10-CM | POA: Insufficient documentation

## 2019-12-23 LAB — COMPREHENSIVE METABOLIC PANEL
ALT: 13 U/L (ref 0–35)
AST: 14 U/L (ref 0–37)
Albumin: 4 g/dL (ref 3.5–5.2)
Alkaline Phosphatase: 67 U/L (ref 39–117)
BUN: 13 mg/dL (ref 6–23)
CO2: 28 mEq/L (ref 19–32)
Calcium: 9.1 mg/dL (ref 8.4–10.5)
Chloride: 106 mEq/L (ref 96–112)
Creatinine, Ser: 0.8 mg/dL (ref 0.40–1.20)
GFR: 77.15 mL/min (ref >60.00)
Glucose, Bld: 86 mg/dL (ref 70–99)
Potassium: 4.7 mEq/L (ref 3.5–5.1)
Sodium: 136 mEq/L (ref 135–145)
Total Bilirubin: 0.3 mg/dL (ref 0.2–1.2)
Total Protein: 7 g/dL (ref 6.0–8.3)

## 2019-12-23 LAB — CBC WITH DIFFERENTIAL/PLATELET
Basophils Absolute: 0.1 10*3/uL (ref 0.0–0.1)
Basophils Relative: 1.2 % (ref 0.0–3.0)
Eosinophils Absolute: 0.2 10*3/uL (ref 0.0–0.7)
Eosinophils Relative: 2.7 % (ref 0.0–5.0)
HCT: 36.8 % (ref 36.0–46.0)
Hemoglobin: 12.2 g/dL (ref 12.0–15.0)
Lymphocytes Relative: 25.2 % (ref 12.0–46.0)
Lymphs Abs: 1.7 10*3/uL (ref 0.7–4.0)
MCHC: 33.1 g/dL (ref 30.0–36.0)
MCV: 83.6 fl (ref 78.0–100.0)
Monocytes Absolute: 0.6 10*3/uL (ref 0.1–1.0)
Monocytes Relative: 9.7 % (ref 3.0–12.0)
Neutro Abs: 4 10*3/uL (ref 1.4–7.7)
Neutrophils Relative %: 61.2 % (ref 43.0–77.0)
Platelets: 354 10*3/uL (ref 150.0–400.0)
RBC: 4.4 Mil/uL (ref 3.87–5.11)
RDW: 14.7 % (ref 11.5–15.5)
WBC: 6.6 10*3/uL (ref 4.0–10.5)

## 2019-12-23 LAB — HEPATIC FUNCTION PANEL
ALT: 13 U/L (ref 0–35)
AST: 14 U/L (ref 0–37)
Albumin: 4 g/dL (ref 3.5–5.2)
Alkaline Phosphatase: 67 U/L (ref 39–117)
Bilirubin, Direct: 0.1 mg/dL (ref 0.0–0.3)
Total Bilirubin: 0.3 mg/dL (ref 0.2–1.2)
Total Protein: 7 g/dL (ref 6.0–8.3)

## 2019-12-23 LAB — LIPID PANEL
Cholesterol: 174 mg/dL (ref 0–200)
HDL: 58.2 mg/dL (ref >39.00)
LDL Cholesterol: 104 mg/dL — ABNORMAL HIGH (ref 0–99)
NonHDL: 116.12
Total CHOL/HDL Ratio: 3
Triglycerides: 59 mg/dL (ref 0.0–149.0)
VLDL: 11.8 mg/dL (ref 0.0–40.0)

## 2019-12-23 LAB — VITAMIN D 25 HYDROXY (VIT D DEFICIENCY, FRACTURES): VITD: 21.07 ng/mL — ABNORMAL LOW (ref 30.00–100.00)

## 2019-12-23 LAB — HEMOGLOBIN A1C: Hgb A1c MFr Bld: 5.6 % (ref 4.6–6.5)

## 2019-12-23 LAB — TSH: TSH: 1.5 u[IU]/mL (ref 0.35–4.50)

## 2019-12-26 LAB — HM MAMMOGRAPHY: HM Mammogram: NORMAL (ref 0–4)

## 2020-01-02 ENCOUNTER — Telehealth: Payer: Self-pay | Admitting: Internal Medicine

## 2020-01-02 NOTE — Telephone Encounter (Signed)
Received mammography report via fax from [facility]. Results have been abstracted and copy sent to scan.

## 2020-03-21 ENCOUNTER — Ambulatory Visit: Payer: 59 | Admitting: Internal Medicine

## 2020-03-30 ENCOUNTER — Telehealth (INDEPENDENT_AMBULATORY_CARE_PROVIDER_SITE_OTHER): Payer: 59 | Admitting: Internal Medicine

## 2020-03-30 ENCOUNTER — Encounter: Payer: Self-pay | Admitting: Internal Medicine

## 2020-03-30 ENCOUNTER — Other Ambulatory Visit: Payer: Self-pay

## 2020-03-30 VITALS — Ht 63.31 in | Wt 220.0 lb

## 2020-03-30 DIAGNOSIS — N926 Irregular menstruation, unspecified: Secondary | ICD-10-CM

## 2020-03-30 DIAGNOSIS — Z1329 Encounter for screening for other suspected endocrine disorder: Secondary | ICD-10-CM | POA: Diagnosis not present

## 2020-03-30 DIAGNOSIS — Z0000 Encounter for general adult medical examination without abnormal findings: Secondary | ICD-10-CM

## 2020-03-30 DIAGNOSIS — Z1389 Encounter for screening for other disorder: Secondary | ICD-10-CM

## 2020-03-30 DIAGNOSIS — R030 Elevated blood-pressure reading, without diagnosis of hypertension: Secondary | ICD-10-CM

## 2020-03-30 DIAGNOSIS — R739 Hyperglycemia, unspecified: Secondary | ICD-10-CM

## 2020-03-30 DIAGNOSIS — E559 Vitamin D deficiency, unspecified: Secondary | ICD-10-CM

## 2020-03-30 NOTE — Progress Notes (Signed)
Telephone Note  I connected with Peggy Rogers   on 03/30/20 at 10:20 AM EDT by telephone application and verified that I am speaking with the correct person using two identifiers.  Location patient: home Location provider:work or home office Persons participating in the virtual visit: patient, provider  I discussed the limitations of evaluation and management by telemedicine and the availability of in person appointments. The patient expressed understanding and agreed to proceed.   HPI: 1. Abnormal cycle yesterday had cycle and had 2-3 x this month will refer to ob/gyn 2. H/o Diverticulitis mild flare tx'ed with clear liquids and resolved. With stress/red meat flares diverticulitis per pt   ROS: See pertinent positives and negatives per HPI.  Past Medical History:  Diagnosis Date  . Allergy   . Arthritis    right>left knee pt helped and dry needling   . Arthritis    right index PIP nodule   . COVID-19    02/2020 sxs fatigue/sob  . Depression   . Diverticulitis   . Migraine     Past Surgical History:  Procedure Laterality Date  . COLONOSCOPY WITH PROPOFOL N/A 09/23/2017   Procedure: COLONOSCOPY WITH PROPOFOL;  Surgeon: Wyline Mood, MD;  Location: [facility] ENDOSCOPY;  Service: Gastroenterology;  Laterality: N/A;  . TUBAL LIGATION     02/1997     Current Outpatient Medications:  .  Cholecalciferol 50000 units capsule, Take 1 capsule (50,000 Units total) by mouth once a week. (Patient not taking: Reported on 12/20/2019), Disp: 13 capsule, Rfl: 1 .  cyclobenzaprine (FLEXERIL) 5 MG tablet, Take 5 mg by mouth 3 (three) times daily as needed for muscle spasms. (Patient not taking: Reported on 03/30/2020), Disp: , Rfl:  .  methocarbamol (ROBAXIN) 750 MG tablet, Take 750 mg by mouth 4 (four) times daily. (Patient not taking: Reported on 03/30/2020), Disp: , Rfl:  .  Probiotic Product (PROBIOTIC DAILY PO), Take by mouth. (Patient not taking: Reported on 12/20/2019), Disp: , Rfl:    EXAM:  VITALS per patient if applicable:  GENERAL: alert, oriented, appears well and in no acute distress  PSYCH/NEURO: pleasant and cooperative, no obvious depression or anxiety, speech and thought processing grossly intact  ASSESSMENT AND PLAN:  Discussed the following assessment and plan:  Abnormal menses - Plan: Ambulatory referral to Obstetrics / Gynecology Dr. Elesa Massed   Elevated blood pressure reading -monitor BP   HM Declines flu shot  Declines covid vaccine but consider in future disc pfizer today Tdap had 2012/2013 Reviewed labs rec hep B vaccine given info for review Hep C negative   12/26/19 [facility] negative Pap3/26/19 negative except yeast 09/23/17 colonoscopy normal repeat rec in 10 years given copy today Last eye exam 2018  rec healthy diet and exercise   Also sees [name] in [place] Dr. [name] since middle school ?macular degeneration mom has spots too  Dentist Dr. [name]  -we discussed possible serious and likely etiologies, options for evaluation and workup, limitations of telemedicine visit vs in person visit, treatment, treatment risks and precautions.     I discussed the assessment and treatment plan with the patient. The patient was provided an opportunity to ask questions and all were answered. The patient agreed with the plan and demonstrated an understanding of the instructions.    Time spent 20 min Bevelyn Buckles, MD

## 2020-06-20 ENCOUNTER — Ambulatory Visit: Payer: 59 | Admitting: Internal Medicine

## 2020-12-28 ENCOUNTER — Other Ambulatory Visit: Payer: 59

## 2021-01-01 ENCOUNTER — Ambulatory Visit: Payer: 59 | Admitting: Internal Medicine

## 2021-05-16 DIAGNOSIS — Z1231 Encounter for screening mammogram for malignant neoplasm of breast: Secondary | ICD-10-CM | POA: Diagnosis not present

## 2021-05-16 LAB — HM MAMMOGRAPHY: HM Mammogram: ABNORMAL — AB (ref 0–4)

## 2021-05-21 ENCOUNTER — Telehealth: Payer: Self-pay | Admitting: Internal Medicine

## 2021-05-21 ENCOUNTER — Encounter: Payer: Self-pay | Admitting: Internal Medicine

## 2021-05-21 NOTE — Telephone Encounter (Signed)
[facility] left breast mass on mammogram 05/16/21  rec she call she needs further imaging diagnostic mammogram and [place] Call [facility] to schedule pt  But I also need order to sign from [facility]

## 2021-05-22 NOTE — Telephone Encounter (Signed)
Patient informed and verbalized understanding.  She is scheduled for tomorrow, 05/23/21

## 2021-05-22 NOTE — Telephone Encounter (Signed)
Patient due for fasting labs and follow up.  Please advise  on what order you would like placed and will call Patient back to schedule

## 2021-05-22 NOTE — Telephone Encounter (Signed)
Does pt have insurance ? For labs? Labs orders in ?

## 2021-05-22 NOTE — Addendum Note (Signed)
Addended by: Quentin Ore on: 05/22/2021 04:13 PM   Modules accepted: Orders

## 2021-05-23 DIAGNOSIS — R922 Inconclusive mammogram: Secondary | ICD-10-CM | POA: Diagnosis not present

## 2021-05-23 DIAGNOSIS — R928 Other abnormal and inconclusive findings on diagnostic imaging of breast: Secondary | ICD-10-CM | POA: Diagnosis not present

## 2021-05-23 LAB — HM MAMMOGRAPHY: HM Mammogram: ABNORMAL — AB (ref 0–4)

## 2021-05-23 NOTE — Telephone Encounter (Signed)
I called the patient and scheduled a follow up and lab with the patient.  Peggy Rogers,cma

## 2021-05-27 ENCOUNTER — Encounter: Payer: Self-pay | Admitting: Internal Medicine

## 2021-05-27 ENCOUNTER — Telehealth: Payer: Self-pay | Admitting: Internal Medicine

## 2021-05-27 NOTE — Telephone Encounter (Signed)
05/16/21 [facility] mammogram abnormal 05/23/21 f/u dx mammo and [place] rec left breast biopsy due to 0.8 x 0.8 x 0.4 cm irregular mass they want to biopsy to make sure it is OR is NOT cancer   I need an order from [facility] phone # [phone] to sign and fax back and pt needs to call to try to schedule bx asap    Dr. French Ana McLean-Scocuzza

## 2021-05-28 NOTE — Telephone Encounter (Signed)
Called and Patient is scheduled for 06/07/21.  Order from [facility] received, signed, and faxed back

## 2021-06-06 ENCOUNTER — Encounter: Payer: Self-pay | Admitting: Internal Medicine

## 2021-06-06 ENCOUNTER — Other Ambulatory Visit: Payer: Self-pay

## 2021-06-06 DIAGNOSIS — N6012 Diffuse cystic mastopathy of left breast: Secondary | ICD-10-CM | POA: Diagnosis not present

## 2021-06-06 DIAGNOSIS — N6324 Unspecified lump in the left breast, lower inner quadrant: Secondary | ICD-10-CM | POA: Diagnosis not present

## 2021-06-13 ENCOUNTER — Telehealth: Payer: Self-pay | Admitting: Internal Medicine

## 2021-06-13 NOTE — Telephone Encounter (Signed)
Spoke with Peggy Rogers with [facility]. Request was put in to have results faxed to [phone]

## 2021-06-13 NOTE — Telephone Encounter (Signed)
Breast bx [facility] done 06/06/21 need copy of pathology report  [phone]

## 2021-06-14 ENCOUNTER — Telehealth: Payer: Self-pay | Admitting: *Deleted

## 2021-06-14 ENCOUNTER — Other Ambulatory Visit: Payer: Self-pay | Admitting: Internal Medicine

## 2021-06-14 DIAGNOSIS — Z1389 Encounter for screening for other disorder: Secondary | ICD-10-CM

## 2021-06-14 DIAGNOSIS — Z1329 Encounter for screening for other suspected endocrine disorder: Secondary | ICD-10-CM

## 2021-06-14 DIAGNOSIS — R739 Hyperglycemia, unspecified: Secondary | ICD-10-CM

## 2021-06-14 DIAGNOSIS — Z0000 Encounter for general adult medical examination without abnormal findings: Secondary | ICD-10-CM

## 2021-06-14 DIAGNOSIS — E559 Vitamin D deficiency, unspecified: Secondary | ICD-10-CM

## 2021-06-14 DIAGNOSIS — E785 Hyperlipidemia, unspecified: Secondary | ICD-10-CM

## 2021-06-14 NOTE — Telephone Encounter (Signed)
Please place future orders for lab appt.  

## 2021-06-14 NOTE — Telephone Encounter (Signed)
Labs in

## 2021-06-17 ENCOUNTER — Other Ambulatory Visit (INDEPENDENT_AMBULATORY_CARE_PROVIDER_SITE_OTHER): Payer: Self-pay

## 2021-06-17 ENCOUNTER — Encounter: Payer: Self-pay | Admitting: Internal Medicine

## 2021-06-17 ENCOUNTER — Other Ambulatory Visit: Payer: Self-pay

## 2021-06-17 ENCOUNTER — Other Ambulatory Visit: Payer: Self-pay | Admitting: Internal Medicine

## 2021-06-17 DIAGNOSIS — R739 Hyperglycemia, unspecified: Secondary | ICD-10-CM

## 2021-06-17 DIAGNOSIS — E559 Vitamin D deficiency, unspecified: Secondary | ICD-10-CM

## 2021-06-17 DIAGNOSIS — Z0000 Encounter for general adult medical examination without abnormal findings: Secondary | ICD-10-CM

## 2021-06-17 DIAGNOSIS — R7303 Prediabetes: Secondary | ICD-10-CM | POA: Insufficient documentation

## 2021-06-17 DIAGNOSIS — Z1329 Encounter for screening for other suspected endocrine disorder: Secondary | ICD-10-CM

## 2021-06-17 DIAGNOSIS — E785 Hyperlipidemia, unspecified: Secondary | ICD-10-CM

## 2021-06-17 DIAGNOSIS — Z1389 Encounter for screening for other disorder: Secondary | ICD-10-CM

## 2021-06-17 LAB — CBC WITH DIFFERENTIAL/PLATELET
Basophils Absolute: 0.1 10*3/uL (ref 0.0–0.1)
Basophils Relative: 0.9 % (ref 0.0–3.0)
Eosinophils Absolute: 0.1 10*3/uL (ref 0.0–0.7)
Eosinophils Relative: 1.5 % (ref 0.0–5.0)
HCT: 36.2 % (ref 36.0–46.0)
Hemoglobin: 11.6 g/dL — ABNORMAL LOW (ref 12.0–15.0)
Lymphocytes Relative: 16.6 % (ref 12.0–46.0)
Lymphs Abs: 1.3 10*3/uL (ref 0.7–4.0)
MCHC: 32 g/dL (ref 30.0–36.0)
MCV: 80.3 fl (ref 78.0–100.0)
Monocytes Absolute: 0.5 10*3/uL (ref 0.1–1.0)
Monocytes Relative: 6.8 % (ref 3.0–12.0)
Neutro Abs: 5.8 10*3/uL (ref 1.4–7.7)
Neutrophils Relative %: 74.2 % (ref 43.0–77.0)
Platelets: 374 10*3/uL (ref 150.0–400.0)
RBC: 4.51 Mil/uL (ref 3.87–5.11)
RDW: 15.6 % — ABNORMAL HIGH (ref 11.5–15.5)
WBC: 7.9 10*3/uL (ref 4.0–10.5)

## 2021-06-17 LAB — TSH: TSH: 1.99 u[IU]/mL (ref 0.35–5.50)

## 2021-06-17 LAB — LIPID PANEL
Cholesterol: 208 mg/dL — ABNORMAL HIGH (ref 0–200)
HDL: 62.4 mg/dL (ref >39.00)
LDL Cholesterol: 130 mg/dL — ABNORMAL HIGH (ref 0–99)
NonHDL: 145.3
Total CHOL/HDL Ratio: 3
Triglycerides: 75 mg/dL (ref 0.0–149.0)
VLDL: 15 mg/dL (ref 0.0–40.0)

## 2021-06-17 LAB — COMPREHENSIVE METABOLIC PANEL
ALT: 11 U/L (ref 0–35)
AST: 14 U/L (ref 0–37)
Albumin: 4 g/dL (ref 3.5–5.2)
Alkaline Phosphatase: 83 U/L (ref 39–117)
BUN: 13 mg/dL (ref 6–23)
CO2: 27 mEq/L (ref 19–32)
Calcium: 9.5 mg/dL (ref 8.4–10.5)
Chloride: 103 mEq/L (ref 96–112)
Creatinine, Ser: 0.9 mg/dL (ref 0.40–1.20)
GFR: 76.03 mL/min (ref >60.00)
Glucose, Bld: 100 mg/dL — ABNORMAL HIGH (ref 70–99)
Potassium: 4.4 mEq/L (ref 3.5–5.1)
Sodium: 139 mEq/L (ref 135–145)
Total Bilirubin: 0.4 mg/dL (ref 0.2–1.2)
Total Protein: 7.1 g/dL (ref 6.0–8.3)

## 2021-06-17 LAB — HEMOGLOBIN A1C: Hgb A1c MFr Bld: 5.8 % (ref 4.6–6.5)

## 2021-06-17 LAB — VITAMIN D 25 HYDROXY (VIT D DEFICIENCY, FRACTURES): VITD: 20.86 ng/mL — ABNORMAL LOW (ref 30.00–100.00)

## 2021-06-17 MED ORDER — CHOLECALCIFEROL 1.25 MG (50000 UT) PO CAPS: 50000.0000 [IU] | ORAL_CAPSULE | ORAL | 1 refills | Status: AC

## 2021-06-18 ENCOUNTER — Telehealth: Payer: Self-pay

## 2021-06-18 LAB — URINALYSIS, ROUTINE W REFLEX MICROSCOPIC
Bilirubin Urine: NEGATIVE
Glucose, UA: NEGATIVE
Hgb urine dipstick: NEGATIVE
Ketones, ur: NEGATIVE
Leukocytes,Ua: NEGATIVE
Nitrite: NEGATIVE
Protein, ur: NEGATIVE
Specific Gravity, Urine: 1.017 (ref 1.001–1.035)
pH: 5.5 (ref 5.0–8.0)

## 2021-06-18 NOTE — Telephone Encounter (Signed)
-----   Message from Bevelyn Buckles, MD sent at 06/18/2021  9:20 AM EST ----- Cholesterol elevated  -mail cholesterol handout  Liver kidneys normal  Vit D low sent weekly D3 1x per week to pharmacy  Thyroid lab normal  Slightly anemic consider multivitamin with iron over the counter  You have prediabetes rec healthy diet and exercise  Urine normal Activate my chart if able

## 2021-06-19 ENCOUNTER — Telehealth: Payer: Self-pay | Admitting: Internal Medicine

## 2021-06-19 ENCOUNTER — Encounter: Payer: Self-pay | Admitting: Internal Medicine

## 2021-06-19 ENCOUNTER — Ambulatory Visit (INDEPENDENT_AMBULATORY_CARE_PROVIDER_SITE_OTHER): Payer: BC Managed Care – PPO | Admitting: Internal Medicine

## 2021-06-19 ENCOUNTER — Other Ambulatory Visit (HOSPITAL_COMMUNITY)
Admission: RE | Admit: 2021-06-19 | Discharge: 2021-06-19 | Disposition: A | Discharge status: Home / Self Care | Payer: BC Managed Care – PPO | Source: Ambulatory Visit | Attending: Internal Medicine | Admitting: Internal Medicine

## 2021-06-19 ENCOUNTER — Other Ambulatory Visit: Payer: Self-pay

## 2021-06-19 VITALS — BP 126/84 | HR 64 | Temp 97.0°F | Ht 63.31 in | Wt 248.4 lb

## 2021-06-19 DIAGNOSIS — Z124 Encounter for screening for malignant neoplasm of cervix: Secondary | ICD-10-CM

## 2021-06-19 DIAGNOSIS — Z0000 Encounter for general adult medical examination without abnormal findings: Secondary | ICD-10-CM

## 2021-06-19 DIAGNOSIS — Z1329 Encounter for screening for other suspected endocrine disorder: Secondary | ICD-10-CM

## 2021-06-19 DIAGNOSIS — Z8249 Family history of ischemic heart disease and other diseases of the circulatory system: Secondary | ICD-10-CM

## 2021-06-19 DIAGNOSIS — Z23 Encounter for immunization: Secondary | ICD-10-CM | POA: Diagnosis not present

## 2021-06-19 DIAGNOSIS — R87610 Atypical squamous cells of undetermined significance on cytologic smear of cervix (ASC-US): Secondary | ICD-10-CM

## 2021-06-19 DIAGNOSIS — K219 Gastro-esophageal reflux disease without esophagitis: Secondary | ICD-10-CM

## 2021-06-19 DIAGNOSIS — Z6841 Body Mass Index (BMI) 40.0 and over, adult: Secondary | ICD-10-CM

## 2021-06-19 DIAGNOSIS — Z1389 Encounter for screening for other disorder: Secondary | ICD-10-CM

## 2021-06-19 DIAGNOSIS — R7303 Prediabetes: Secondary | ICD-10-CM | POA: Diagnosis not present

## 2021-06-19 DIAGNOSIS — E785 Hyperlipidemia, unspecified: Secondary | ICD-10-CM

## 2021-06-19 DIAGNOSIS — Z1231 Encounter for screening mammogram for malignant neoplasm of breast: Secondary | ICD-10-CM

## 2021-06-19 DIAGNOSIS — E559 Vitamin D deficiency, unspecified: Secondary | ICD-10-CM

## 2021-06-19 HISTORY — DX: Atypical squamous cells of undetermined significance on cytologic smear of cervix (ASC-US): R87.610

## 2021-06-19 MED ORDER — TETANUS-DIPHTH-ACELL PERTUSSIS 5-2.5-18.5 LF-MCG/0.5 IM SUSP: 0.5000 mL | Freq: Once | INTRAMUSCULAR | 0 refills | Status: AC

## 2021-06-19 NOTE — Progress Notes (Signed)
Chief Complaint  Patient presents with   Follow-up   Annual doing well  1. Labs +prediabetes A1c 5.8, +hld and gained wt with vit d def 2. C/o GERD tries tums otc likes spice foods  3. Fh heart disease mom had MI age 47 y.o will let me know if wants CT calcium score upcoming    Review of Systems  Constitutional:  Negative for weight loss.  HENT:  Negative for hearing loss.   Eyes:  Negative for blurred vision.  Respiratory:  Negative for shortness of breath.   Cardiovascular:  Negative for chest pain.  Gastrointestinal:  Negative for abdominal pain and blood in stool.  Genitourinary:  Negative for dysuria.  Musculoskeletal:  Negative for falls and joint pain.  Skin:  Negative for rash.  Neurological:  Negative for headaches.  Psychiatric/Behavioral:  Negative for depression.   Past Medical History:  Diagnosis Date   Allergy    Arthritis    right>left knee pt helped and dry needling    Arthritis    right index PIP nodule    COVID-19    02/2020 sxs fatigue/sob, 2022   Depression    Diverticulitis    Migraine    Past Surgical History:  Procedure Laterality Date   COLONOSCOPY WITH PROPOFOL N/A 09/23/2017   Procedure: COLONOSCOPY WITH PROPOFOL;  Surgeon: Wyline Mood, MD;  Location: [facility] ENDOSCOPY;  Service: Gastroenterology;  Laterality: N/A;   TUBAL LIGATION     02/1997   Family History  Problem Relation Age of Onset   Cancer Mother        skin   Hearing loss Mother    Heart disease Mother        31 mi   Hypertension Mother    Cancer Father        esophageal smoker    Early death Father    Cancer Maternal Grandmother        skin   Diabetes Maternal Grandfather    Heart disease Maternal Grandfather    Hypertension Maternal Grandfather    Cancer Paternal Grandmother        skin   Arthritis Paternal Grandfather    Cancer Paternal Grandfather        leukemia   Hearing loss Paternal Grandfather    Social History   Socioeconomic History   Marital status: Married     Spouse name: Not on file   Number of children: Not on file   Years of education: Not on file   Highest education level: Not on file  Occupational History   Not on file  Tobacco Use   Smoking status: Former   Smokeless tobacco: Never   Tobacco comments:    former smoker quit 1 week ago 08/2017 since age 61 y.o   Vaping Use   Vaping Use: Never used  Substance and Sexual Activity   Alcohol use: Yes   Drug use: No   Sexual activity: Yes    Partners: Female  Other Topics Concern   Not on file  Social History Narrative   Former smoker age 77 to 56    No guns    Wears seatbelt    Safe in relationship    Some college    2 daughters 2 grandkids    Divorced, sexually active women ID as woman    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   Tdap (BOOSTRIX) 5-2.5-18.5 LF-MCG/0.5 injection Inject 0.5 mLs into the muscle once for 1 dose.   Allergies  Allergen Reactions   Penicillins Rash   Recent Results (from the past 2160 hour(s))  [facility] MAMMOGRAPHY     Status: Abnormal   Collection Time: 05/16/21 12:00 AM  Result Value Ref Range   HM Mammogram Self Reported Abnormal (A) 0-4 Bi-Rad, Self Reported Normal    Comment: [facility] 05/16/21 Lbreast mass [facility]  [facility] MAMMOGRAPHY     Status: Abnormal   Collection Time: 05/23/21 12:00 AM  Result Value Ref Range   HM Mammogram Self Reported Abnormal (A) 0-4 Bi-Rad, Self Reported Normal    Comment: abnormal 05/16/21 left breast mass 05/23/21 rec bx [facility]  Hemoglobin A1c     Status: None   Collection  Time: 06/17/21  9:17 AM  Result Value Ref Range   Hgb A1c MFr Bld 5.8 4.6 - 6.5 %    Comment: Glycemic Control Guidelines for People with Diabetes:Non Diabetic:  <6%Goal of Therapy: <7%Additional Action Suggested:  >8%   Vitamin D (25 hydroxy)     Status: Abnormal   Collection Time: 06/17/21  9:17 AM  Result Value Ref Range   VITD 20.86 (L) 30.00 - 100.00 ng/mL  TSH     Status: None   Collection Time: 06/17/21  9:17 AM  Result Value Ref Range   TSH 1.99 0.35 - 5.50 uIU/mL  Urinalysis, Routine w reflex microscopic     Status: None   Collection Time: 06/17/21  9:17 AM  Result Value Ref Range   Color, Urine YELLOW YELLOW   APPearance CLEAR CLEAR   Specific Gravity, Urine 1.017 1.001 - 1.035   pH 5.5 5.0 - 8.0   Glucose, UA NEGATIVE NEGATIVE   Bilirubin Urine NEGATIVE NEGATIVE   Ketones, ur NEGATIVE NEGATIVE   Hgb urine dipstick NEGATIVE NEGATIVE   Protein, ur NEGATIVE NEGATIVE   Nitrite NEGATIVE NEGATIVE   Leukocytes,Ua NEGATIVE NEGATIVE  CBC w/Diff     Status: Abnormal   Collection Time: 06/17/21  9:17 AM  Result Value Ref Range   WBC 7.9 4.0 - 10.5 K/uL   RBC 4.51 3.87 - 5.11 Mil/uL   Hemoglobin 11.6 (L) 12.0 - 15.0 g/dL   HCT 02.7 25.3 - 66.4 %   MCV 80.3 78.0 - 100.0 fl   MCHC 32.0 30.0 - 36.0 g/dL   RDW 40.3 (H) 47.4 - 25.9 %   Platelets 374.0 150.0 - 400.0 K/uL   Neutrophils Relative % 74.2 43.0 - 77.0 %   Lymphocytes Relative 16.6 12.0 - 46.0 %   Monocytes Relative 6.8 3.0 - 12.0 %   Eosinophils Relative 1.5 0.0 - 5.0 %   Basophils Relative 0.9 0.0 - 3.0 %   Neutro Abs 5.8 1.4 - 7.7 K/uL   Lymphs Abs 1.3 0.7 - 4.0 K/uL   Monocytes Absolute 0.5 0.1 - 1.0 K/uL   Eosinophils Absolute 0.1 0.0 - 0.7 K/uL   Basophils Absolute 0.1 0.0 - 0.1 K/uL  Lipid panel     Status: Abnormal   Collection Time: 06/17/21  9:17 AM  Result Value  Ref Range   Cholesterol 208 (H) 0 - 200 mg/dL    Comment: ATP III Classification       Desirable:  < 200 mg/dL               Borderline High:   200 - 239 mg/dL          High:  > = 811 mg/dL   Triglycerides 91.4 0.0 - 149.0 mg/dL    Comment: Normal:  <782 mg/dLBorderline High:  150 - 199 mg/dL   HDL 95.62 >13.08 mg/dL   VLDL 65.7 0.0 - 84.6 mg/dL   LDL Cholesterol 962 (H) 0 - 99 mg/dL   Total CHOL/HDL Ratio 3     Comment:                Men          Women1/2 Average Risk     3.4          3.3Average Risk          5.0          4.42X Average Risk          9.6          7.13X Average Risk          15.0          11.0                       NonHDL 145.30     Comment: NOTE:  Non-HDL goal should be 30 mg/dL higher than patient's LDL goal (i.e. LDL goal of < 70 mg/dL, would have non-HDL goal of < 100 mg/dL)  Comprehensive metabolic panel     Status: Abnormal   Collection Time: 06/17/21  9:17 AM  Result Value Ref Range   Sodium 139 135 - 145 mEq/L   Potassium 4.4 3.5 - 5.1 mEq/L   Chloride 103 96 - 112 mEq/L   CO2 27 19 - 32 mEq/L   Glucose, Bld 100 (H) 70 - 99 mg/dL   BUN 13 6 - 23 mg/dL   Creatinine, Ser 9.52 0.40 - 1.20 mg/dL   Total Bilirubin 0.4 0.2 - 1.2 mg/dL   Alkaline Phosphatase 83 39 - 117 U/L   AST 14 0 - 37 U/L   ALT 11 0 - 35 U/L   Total Protein 7.1 6.0 - 8.3 g/dL   Albumin 4.0 3.5 - 5.2 g/dL   GFR 84.13 >24.40 mL/min    Comment: Calculated using the CKD-EPI Creatinine Equation (2021)   Calcium 9.5 8.4 - 10.5 mg/dL   Objective  Body mass index is 43.57 kg/m. Wt Readings from Last 3 Encounters:  06/19/21 248 lb 6.4 oz (112.7 kg)  03/30/20 220 lb (99.8 kg)  12/20/19 219 lb (99.3 kg)   Temp Readings from Last 3 Encounters:  06/19/21 (!) 97 F (36.1 C) (Temporal)  12/20/19 (!) 97.4 F (36.3 C) (Temporal)  09/29/17 98.2 F (36.8 C) (Oral)   BP Readings from Last 3 Encounters:  06/19/21 126/84  12/20/19 130/88  09/29/17 120/80   Pulse Readings from Last 3 Encounters:  06/19/21 64  12/20/19 70  09/29/17 (!) 57    Physical Exam Vitals and nursing note reviewed.  Constitutional:      Appearance: Normal  appearance. She is well-developed and well-groomed.  HENT:     Head: Normocephalic and atraumatic.  Eyes:     Conjunctiva/sclera: Conjunctivae normal.     Pupils: Pupils are  equal, round, and reactive to light.  Cardiovascular:     Rate and Rhythm: Normal rate and regular rhythm.     Heart sounds: Normal heart sounds. No murmur heard. Pulmonary:     Effort: Pulmonary effort is normal.     Breath sounds: Normal breath sounds.  Abdominal:     General: Abdomen is flat. Bowel sounds are normal.     Tenderness: There is no abdominal tenderness.  Genitourinary:    Exam position: Supine.     Pubic Area: No rash.      Labia:        Right: No rash.        Left: No rash.      Vagina: Normal.     Cervix: Normal.     Uterus: Normal.      Adnexa: Right adnexa normal and left adnexa normal.  Musculoskeletal:        General: No tenderness.  Skin:    General: Skin is warm and dry.  Neurological:     General: No focal deficit present.     Mental Status: She is alert and oriented to person, place, and time. Mental status is at baseline.     Cranial Nerves: Cranial nerves 2-12 are intact.     Gait: Gait is intact.  Psychiatric:        Attention and Perception: Attention and perception normal.        Mood and Affect: Mood and affect normal.        Speech: Speech normal.        Behavior: Behavior normal. Behavior is cooperative.        Thought Content: Thought content normal.        Cognition and Memory: Cognition and memory normal.        Judgment: Judgment normal.    Assessment  Plan  Annual physical exam Flu shot today  Rx tdap  See below  Hyperlipidemia, unspecified hyperlipidemia type Prediabetes 5.8  Rec healthy diet and exercise    Gastroesophageal reflux disease without esophagitis Otc pepcid, nexium, prilosec if needed can do higher does   FH: heart disease With hld consider ct calcium score   HM Flu shot today Declines covid vaccine but consider in future disc  pfizer today Tdap had 2012/2013 given rx 06/19/21  Reviewed labs rec hep B vaccine given info for review  Hep C negative    12/26/19 [facility] negative, bx 05/2021 pending results due to abnormal mammogram ordered for 05/2022  Pap 09/29/17 negative except yeast  Pap today   09/23/17 colonoscopy normal repeat rec in 10 years  Last eye exam 2018   Former smoker quit 08/2017   rec healthy diet and exercise    Also sees [facility] in [place] Dr. [name] since middle school ?macular degeneration mom has spots too  Dentist Dr. [name]   Provider: Dr. French Ana McLean-Scocuzza-[facility]

## 2021-06-19 NOTE — Patient Instructions (Addendum)
Let me know when ready for CT chest calcium score test    D3 4000 IU daily starting month 7   Multivitamin  Mucinex dm green label Elderberry  Oil of oregano  cepacol or chloroseptic spray  Warm salt gargles  Sugar free cough drops Warm tea with honey and lemon  Hydration  Try to eat though you dont feel like it   Tylenol  Nasal saline  Flonase     Monitor pulse oximeter, buy from [facility] if oxygen is less than 90 please go to the hospital.      I would try pepcid for GERD up to 2x per day, consider prilosec 20 or nexium 20 over the counter 30 minutes before food    Food Choices for Gastroesophageal Reflux Disease, Adult When you have gastroesophageal reflux disease (GERD), the foods you eat and your eating habits are very important. Choosing the right foods can help ease the discomfort of GERD. Consider working with a dietitian to help you make healthy food choices. What are tips for following this plan? Reading food labels Look for foods that are low in saturated fat. Foods that have less than 5% of daily value (DV) of fat and 0 g of trans fats may help with your symptoms. Cooking Cook foods using methods other than frying. This may include baking, steaming, grilling, or broiling. These are all methods that do not need a lot of fat for cooking. To add flavor, try to use herbs that are low in spice and acidity. Meal planning  Choose healthy foods that are low in fat, such as fruits, vegetables, whole grains, low-fat dairy products, lean meats, fish, and poultry. Eat frequent, small meals instead of three large meals each day. Eat your meals slowly, in a relaxed setting. Avoid bending over or lying down until 2-3 hours after eating. Limit high-fat foods such as fatty meats or fried foods. Limit your intake of fatty foods, such as oils, butter, and shortening. Avoid the following as told by your health care provider: Foods that cause symptoms. These may be different for  different people. Keep a food diary to keep track of foods that cause symptoms. Alcohol. Drinking large amounts of liquid with meals. Eating meals during the 2-3 hours before bed. Lifestyle Maintain a healthy weight. Ask your health care provider what weight is healthy for you. If you need to lose weight, work with your health care provider to do so safely. Exercise for at least 30 minutes on 5 or more days each week, or as told by your health care provider. Avoid wearing clothes that fit tightly around your waist and chest. Do not use any products that contain nicotine or tobacco. These products include cigarettes, chewing tobacco, and vaping devices, such as e-cigarettes. If you need help quitting, ask your health care provider. Sleep with the head of your bed raised. Use a wedge under the mattress or blocks under the bed frame to raise the head of the bed. Chew sugar-free gum after mealtimes. What foods should I eat? Eat a healthy, well-balanced diet of fruits, vegetables, whole grains, low-fat dairy products, lean meats, fish, and poultry. Each person is different. Foods that may trigger symptoms in one person may not trigger any symptoms in another person. Work with your health care provider to identify foods that are safe for you. The items listed above may not be a complete list of recommended foods and beverages. Contact a dietitian for more information. What foods should I avoid? Limiting  some of these foods may help manage the symptoms of GERD. Everyone is different. Consult a dietitian or your health care provider to help you identify the exact foods to avoid, if any. Fruits Any fruits prepared with added fat. Any fruits that cause symptoms. For some people this may include citrus fruits, such as oranges, grapefruit, pineapple, and lemons. Vegetables Deep-fried vegetables. [place] fries. Any vegetables prepared with added fat. Any vegetables that cause symptoms. For some people, this may  include tomatoes and tomato products, chili peppers, onions and garlic, and horseradish. Grains Pastries or quick breads with added fat. Meats and other proteins High-fat meats, such as fatty beef or pork, hot dogs, ribs, ham, sausage, salami, and bacon. Fried meat or protein, including fried fish and fried chicken. Nuts and nut butters, in large amounts. Dairy Whole milk and chocolate milk. Sour cream. Cream. Ice cream. Cream cheese. Milkshakes. Fats and oils Butter. Margarine. Shortening. Ghee. Beverages Coffee and tea, with or without caffeine. Carbonated beverages. Sodas. Energy drinks. Fruit juice made with acidic fruits, such as orange or grapefruit. Tomato juice. Alcoholic drinks. Sweets and desserts Chocolate and cocoa. Donuts. Seasonings and condiments Pepper. Peppermint and spearmint. Added salt. Any condiments, herbs, or seasonings that cause symptoms. For some people, this may include curry, hot sauce, or vinegar-based salad dressings. The items listed above may not be a complete list of foods and beverages to avoid. Contact a dietitian for more information. Questions to ask your health care provider Diet and lifestyle changes are usually the first steps that are taken to manage symptoms of GERD. If diet and lifestyle changes do not improve your symptoms, talk with your health care provider about taking medicines. Where to find more information International Foundation for Gastrointestinal Disorders: aboutgerd.org Summary When you have gastroesophageal reflux disease (GERD), food and lifestyle choices may be very helpful in easing the discomfort of GERD. Eat frequent, small meals instead of three large meals each day. Eat your meals slowly, in a relaxed setting. Avoid bending over or lying down until 2-3 hours after eating. Limit high-fat foods such as fatty meats or fried foods. This information is not intended to replace advice given to you by your health care provider. Make  sure you discuss any questions you have with your health care provider. Document Revised: 01/02/2020 Document Reviewed: 01/02/2020 Elsevier Patient Education  2022 Elsevier Inc.    Cholesterol Content in Foods Cholesterol is a waxy, fat-like substance that helps to carry fat in the blood. The body needs cholesterol in small amounts, but too much cholesterol can cause damage to the arteries and heart. What foods have cholesterol? Cholesterol is found in animal-based foods, such as meat, seafood, and dairy. Generally, low-fat dairy and lean meats have less cholesterol than full-fat dairy and fatty meats. The milligrams of cholesterol per serving (mg per serving) of common cholesterol-containing foods are listed below. Meats and other proteins Egg -- one large whole egg has 186 mg. Veal shank -- 4 oz (113 g) has 141 mg. Lean ground [place] (93% lean) -- 4 oz (113 g) has 118 mg. Fat-trimmed lamb loin -- 4 oz (113 g) has 106 mg. Lean ground beef (90% lean) -- 4 oz (113 g) has 100 mg. Lobster -- 3.5 oz (99 g) has 90 mg. Pork loin chops -- 4 oz (113 g) has 86 mg. Canned salmon -- 3.5 oz (99 g) has 83 mg. Fat-trimmed beef top loin -- 4 oz (113 g) has 78 mg. Frankfurter -- 1 frank (3.5  oz or 99 g) has 77 mg. Crab -- 3.5 oz (99 g) has 71 mg. Roasted chicken without skin, white meat -- 4 oz (113 g) has 66 mg. Light bologna -- 2 oz (57 g) has 45 mg. Deli-cut [place] -- 2 oz (57 g) has 31 mg. Canned tuna -- 3.5 oz (99 g) has 31 mg. Tomasa Blase -- 1 oz (28 g) has 29 mg. Oysters and mussels (raw) -- 3.5 oz (99 g) has 25 mg. Mackerel -- 1 oz (28 g) has 22 mg. Trout -- 1 oz (28 g) has 20 mg. Pork sausage -- 1 link (1 oz or 28 g) has 17 mg. Salmon -- 1 oz (28 g) has 16 mg. Tilapia -- 1 oz (28 g) has 14 mg. Dairy Soft-serve ice cream --  cup (4 oz or 86 g) has 103 mg. Whole-milk yogurt -- 1 cup (8 oz or 245 g) has 29 mg. Cheddar cheese -- 1 oz (28 g) has 28 mg. American cheese -- 1 oz (28 g) has 28  mg. Whole milk -- 1 cup (8 oz or 250 mL) has 23 mg. 2% milk -- 1 cup (8 oz or 250 mL) has 18 mg. Cream cheese -- 1 tablespoon (Tbsp) (14.5 g) has 15 mg. Cottage cheese --  cup (4 oz or 113 g) has 14 mg. Low-fat (1%) milk -- 1 cup (8 oz or 250 mL) has 10 mg. Sour cream -- 1 Tbsp (12 g) has 8.5 mg. Low-fat yogurt -- 1 cup (8 oz or 245 g) has 8 mg. Nonfat Greek yogurt -- 1 cup (8 oz or 228 g) has 7 mg. Half-and-half cream -- 1 Tbsp (15 mL) has 5 mg. Fats and oils Cod liver oil -- 1 tablespoon (Tbsp) (13.6 g) has 82 mg. Butter -- 1 Tbsp (14 g) has 15 mg. Lard -- 1 Tbsp (12.8 g) has 14 mg. Bacon grease -- 1 Tbsp (12.9 g) has 14 mg. Mayonnaise -- 1 Tbsp (13.8 g) has 5-10 mg. Margarine -- 1 Tbsp (14 g) has 3-10 mg. The items listed above may not be a complete list of foods with cholesterol. Exact amounts of cholesterol in these foods may vary depending on specific ingredients and brands. Contact a dietitian for more information. What foods do not have cholesterol? Most plant-based foods do not have cholesterol unless you combine them with a food that has cholesterol. Foods without cholesterol include: Grains and cereals. Vegetables. Fruits. Vegetable oils, such as olive, canola, and sunflower oil. Legumes, such as peas, beans, and lentils. Nuts and seeds. Egg whites. The items listed above may not be a complete list of foods that do not have cholesterol. Contact a dietitian for more information. Summary The body needs cholesterol in small amounts, but too much cholesterol can cause damage to the arteries and heart. Cholesterol is found in animal-based foods, such as meat, seafood, and dairy. Generally, low-fat dairy and lean meats have less cholesterol than full-fat dairy and fatty meats. This information is not intended to replace advice given to you by your health care provider. Make sure you discuss any questions you have with your health care provider. Document Revised: 11/02/2020  Document Reviewed: 11/02/2020 Elsevier Patient Education  2022 Elsevier Inc.  High Cholesterol High cholesterol is a condition in which the blood has high levels of a white, waxy substance similar to fat (cholesterol). The liver makes all the cholesterol that the body needs. The human body needs small amounts of cholesterol to help build cells. A person  gets extra or excess cholesterol from the food that he or she eats. The blood carries cholesterol from the liver to the rest of the body. If you have high cholesterol, deposits (plaques) may build up on the walls of your arteries. Arteries are the blood vessels that carry blood away from your heart. These plaques make the arteries narrow and stiff. Cholesterol plaques increase your risk for heart attack and stroke. Work with your health care provider to keep your cholesterol levels in a healthy range. What increases the risk? The following factors may make you more likely to develop this condition: Eating foods that are high in animal fat (saturated fat) or cholesterol. Being overweight. Not getting enough exercise. A family history of high cholesterol (familial hypercholesterolemia). Use of tobacco products. Having diabetes. What are the signs or symptoms? In most cases, high cholesterol does not usually cause any symptoms. In severe cases, very high cholesterol levels can cause: Fatty bumps under the skin (xanthomas). A white or gray ring around the black center (pupil) of the eye. How is this diagnosed? This condition may be diagnosed based on the results of a blood test. If you are older than 47 years of age, your health care provider may check your cholesterol levels every 4-6 years. You may be checked more often if you have high cholesterol or other risk factors for heart disease. The blood test for cholesterol measures: "Bad" cholesterol, or LDL cholesterol. This is the main type of cholesterol that causes heart disease. The desired  level is less than 100 mg/dL (4.54 mmol/L). "Good" cholesterol, or HDL cholesterol. HDL helps protect against heart disease by cleaning the arteries and carrying the LDL to the liver for processing. The desired level for HDL is 60 mg/dL (0.98 mmol/L) or higher. Triglycerides. These are fats that your body can store or burn for energy. The desired level is less than 150 mg/dL (1.19 mmol/L). Total cholesterol. This measures the total amount of cholesterol in your blood and includes LDL, HDL, and triglycerides. The desired level is less than 200 mg/dL (1.47 mmol/L). How is this treated? Treatment for high cholesterol starts with lifestyle changes, such as diet and exercise. Diet changes. You may be asked to eat foods that have more fiber and less saturated fats or added sugar. Lifestyle changes. These may include regular exercise, maintaining a healthy weight, and quitting use of tobacco products. Medicines. These are given when diet and lifestyle changes have not worked. You may be prescribed a statin medicine to help lower your cholesterol levels. Follow these instructions at home: Eating and drinking  Eat a healthy, balanced diet. This diet includes: Daily servings of a variety of fresh, frozen, or canned fruits and vegetables. Daily servings of whole grain foods that are rich in fiber. Foods that are low in saturated fats and trans fats. These include poultry and fish without skin, lean cuts of meat, and low-fat dairy products. A variety of fish, especially oily fish that contain omega-3 fatty acids. Aim to eat fish at least 2 times a week. Avoid foods and drinks that have added sugar. Use healthy cooking methods, such as roasting, grilling, broiling, baking, poaching, steaming, and stir-frying. Do not fry your food except for stir-frying. If you drink alcohol: Limit how much you have to: 0-1 drink a day for women who are not pregnant. 0-2 drinks a day for men. Know how much alcohol is in a  drink. In the [place] one drink equals one 12 oz bottle of beer (  355 mL), one 5 oz glass of wine (148 mL), or one 1 oz glass of hard liquor (44 mL). Lifestyle  Get regular exercise. Aim to exercise for a total of 150 minutes a week. Increase your activity level by doing activities such as gardening, walking, and taking the stairs. Do not use any products that contain nicotine or tobacco. These products include cigarettes, chewing tobacco, and vaping devices, such as e-cigarettes. If you need help quitting, ask your health care provider. General instructions Take over-the-counter and prescription medicines only as told by your health care provider. Keep all follow-up visits. This is important. Where to find more information American Heart Association: www.heart.org National Heart, Lung, and Blood Institute: PopSteam.is Contact a health care provider if: You have trouble achieving or maintaining a healthy diet or weight. You are starting an exercise program. You are unable to stop smoking. Get help right away if: You have chest pain. You have trouble breathing. You have discomfort or pain in your jaw, neck, back, shoulder, or arm. You have any symptoms of a stroke. "BE FAST" is an easy way to remember the main warning signs of a stroke: B - Balance. Signs are dizziness, sudden trouble walking, or loss of balance. E - Eyes. Signs are trouble seeing or a sudden change in vision. F - Face. Signs are sudden weakness or numbness of the face, or the face or eyelid drooping on one side. A - Arms. Signs are weakness or numbness in an arm. This happens suddenly and usually on one side of the body. S - Speech. Signs are sudden trouble speaking, slurred speech, or trouble understanding what people say. T - Time. Time to call emergency services. Write down what time symptoms started. You have other signs of a stroke, such as: A sudden, severe headache with no known cause. Nausea or  vomiting. Seizure. These symptoms may represent a serious problem that is an emergency. Do not wait to see if the symptoms will go away. Get medical help right away. Call your local emergency services (911 in the [place] Do not drive yourself to the hospital. Summary Cholesterol plaques increase your risk for heart attack and stroke. Work with your health care provider to keep your cholesterol levels in a healthy range. Eat a healthy, balanced diet, get regular exercise, and maintain a healthy weight. Do not use any products that contain nicotine or tobacco. These products include cigarettes, chewing tobacco, and vaping devices, such as e-cigarettes. Get help right away if you have any symptoms of a stroke. This information is not intended to replace advice given to you by your health care provider. Make sure you discuss any questions you have with your health care provider. Document Revised: 09/06/2020 Document Reviewed: 08/27/2020 Elsevier Patient Education  2022 Elsevier Inc.  Prediabetes Eating Plan Prediabetes is a condition that causes blood sugar (glucose) levels to be higher than normal. This increases the risk for developing type 2 diabetes (type 2 diabetes mellitus). Working with a health care provider or nutrition specialist (dietitian) to make diet and lifestyle changes can help prevent the onset of diabetes. These changes may help you: Control your blood glucose levels. Improve your cholesterol levels. Manage your blood pressure. What are tips for following this plan? Reading food labels Read food labels to check the amount of fat, salt (sodium), and sugar in prepackaged foods. Avoid foods that have: Saturated fats. Trans fats. Added sugars. Avoid foods that have more than 300 milligrams (mg) of sodium per serving. Limit  your sodium intake to less than 2,300 mg each day. Shopping Avoid buying pre-made and processed foods. Avoid buying drinks with added sugar. Cooking Cook  with olive oil. Do not use butter, lard, or ghee. Bake, broil, grill, steam, or boil foods. Avoid frying. Meal planning  Work with your dietitian to create an eating plan that is right for you. This may include tracking how many calories you take in each day. Use a food diary, notebook, or mobile application to track what you eat at each meal. Consider following a Mediterranean diet. This includes: Eating several servings of fresh fruits and vegetables each day. Eating fish at least twice a week. Eating one serving each day of whole grains, beans, nuts, and seeds. Using olive oil instead of other fats. Limiting alcohol. Limiting red meat. Using nonfat or low-fat dairy products. Consider following a plant-based diet. This includes dietary choices that focus on eating mostly vegetables and fruit, grains, beans, nuts, and seeds. If you have high blood pressure, you may need to limit your sodium intake or follow a diet such as the DASH (Dietary Approaches to Stop Hypertension) eating plan. The DASH diet aims to lower high blood pressure. Lifestyle Set weight loss goals with help from your health care team. It is recommended that most people with prediabetes lose 7% of their body weight. Exercise for at least 30 minutes 5 or more days a week. Attend a support group or seek support from a mental health counselor. Take over-the-counter and prescription medicines only as told by your health care provider. What foods are recommended? Fruits Berries. Bananas. Apples. Oranges. Grapes. Papaya. Mango. Pomegranate. Kiwi. Grapefruit. Cherries. Vegetables Lettuce. Spinach. Peas. Beets. Cauliflower. Cabbage. Broccoli. Carrots. Tomatoes. Squash. Eggplant. Herbs. Peppers. Onions. Cucumbers. Brussels sprouts. Grains Whole grains, such as whole-wheat or whole-grain breads, crackers, cereals, and pasta. Unsweetened oatmeal. Bulgur. Barley. Quinoa. Brown rice. Corn or whole-wheat flour tortillas or taco  shells. Meats and other proteins Seafood. Poultry without skin. Lean cuts of pork and beef. Tofu. Eggs. Nuts. Beans. Dairy Low-fat or fat-free dairy products, such as yogurt, cottage cheese, and cheese. Beverages Water. Tea. Coffee. Sugar-free or diet soda. Seltzer water. Low-fat or nonfat milk. Milk alternatives, such as soy or almond milk. Fats and oils Olive oil. Canola oil. Sunflower oil. Grapeseed oil. Avocado. Walnuts. Sweets and desserts Sugar-free or low-fat pudding. Sugar-free or low-fat ice cream and other frozen treats. Seasonings and condiments Herbs. Sodium-free spices. Mustard. Relish. Low-salt, low-sugar ketchup. Low-salt, low-sugar barbecue sauce. Low-fat or fat-free mayonnaise. The items listed above may not be a complete list of recommended foods and beverages. Contact a dietitian for more information. What foods are not recommended? Fruits Fruits canned with syrup. Vegetables Canned vegetables. Frozen vegetables with butter or cream sauce. Grains Refined white flour and flour products, such as bread, pasta, snack foods, and cereals. Meats and other proteins Fatty cuts of meat. Poultry with skin. Breaded or fried meat. Processed meats. Dairy Full-fat yogurt, cheese, or milk. Beverages Sweetened drinks, such as iced tea and soda. Fats and oils Butter. Lard. Ghee. Sweets and desserts Baked goods, such as cake, cupcakes, pastries, cookies, and cheesecake. Seasonings and condiments Spice mixes with added salt. Ketchup. Barbecue sauce. Mayonnaise. The items listed above may not be a complete list of foods and beverages that are not recommended. Contact a dietitian for more information. Where to find more information American Diabetes Association: www.diabetes.org Summary You may need to make diet and lifestyle changes to help prevent the onset of diabetes.  These changes can help you control blood sugar, improve cholesterol levels, and manage blood pressure. Set  weight loss goals with help from your health care team. It is recommended that most people with prediabetes lose 7% of their body weight. Consider following a Mediterranean diet. This includes eating plenty of fresh fruits and vegetables, whole grains, beans, nuts, seeds, fish, and low-fat dairy, and using olive oil instead of other fats. This information is not intended to replace advice given to you by your health care provider. Make sure you discuss any questions you have with your health care provider. Document Revised: 09/22/2019 Document Reviewed: 09/22/2019 Elsevier Patient Education  2022 [employer].

## 2021-06-19 NOTE — Telephone Encounter (Signed)
Need final biopsy report from [facility] please  Thank you

## 2021-06-20 NOTE — Telephone Encounter (Signed)
Attempted to call [facility] at their open this morning 7:30 am. Their phone center was not open as of yet, will attempt to contact them again.

## 2021-06-20 NOTE — Telephone Encounter (Signed)
See 06/13/21 telephone encounter

## 2021-06-24 ENCOUNTER — Encounter: Payer: Self-pay | Admitting: Internal Medicine

## 2021-06-24 DIAGNOSIS — R87610 Atypical squamous cells of undetermined significance on cytologic smear of cervix (ASC-US): Secondary | ICD-10-CM | POA: Insufficient documentation

## 2021-06-24 LAB — CYTOLOGY - PAP
Comment: NEGATIVE
Diagnosis: UNDETERMINED — AB
High risk HPV: NEGATIVE

## 2021-06-25 ENCOUNTER — Telehealth: Payer: Self-pay | Admitting: Internal Medicine

## 2021-06-25 NOTE — Telephone Encounter (Signed)
Patient calling in she advised received a call and wanting a call bck at [phone]

## 2021-06-25 NOTE — Telephone Encounter (Signed)
Patient called in returning [name] call, Please call patient back  @ [phone]

## 2021-06-25 NOTE — Telephone Encounter (Signed)
Left message to return call 

## 2021-06-26 NOTE — Telephone Encounter (Signed)
Patient informed and verbalized understanding.  See result note. °

## 2021-06-27 ENCOUNTER — Ambulatory Visit: Payer: Self-pay | Admitting: Internal Medicine

## 2021-06-27 DIAGNOSIS — R87610 Atypical squamous cells of undetermined significance on cytologic smear of cervix (ASC-US): Secondary | ICD-10-CM | POA: Insufficient documentation

## 2021-06-27 NOTE — Addendum Note (Signed)
Addended by: Quentin Ore on: 06/27/2021 05:59 PM   Modules accepted: Orders

## 2021-08-21 ENCOUNTER — Telehealth: Payer: Self-pay | Admitting: Internal Medicine

## 2021-08-21 NOTE — Telephone Encounter (Signed)
Rejection Reason - Patient did not respond" Elgie Collard said on Aug 21, 2021 10:39 AM  "LEFT MESSAGE ON VM" Elgie Collard said on Aug 21, 2021 10:38 AM  "08/01/2021 - LM ON VM" Elgie Collard said on Aug 01, 2021 8:43 AM  "07/04/2021 LEFT MSG ON VM @ 2:33PM/KLT" Earna Coder said on Jul 04, 2021 2:38 PM  Msg from [facility]

## 2022-04-23 ENCOUNTER — Other Ambulatory Visit: Payer: Self-pay

## 2022-04-23 ENCOUNTER — Emergency Department (HOSPITAL_COMMUNITY): Payer: BC Managed Care – PPO

## 2022-04-23 ENCOUNTER — Emergency Department (HOSPITAL_COMMUNITY)
Admission: EM | Admit: 2022-04-23 | Discharge: 2022-04-23 | Disposition: A | Discharge status: Home / Self Care | Payer: BC Managed Care – PPO | Attending: Emergency Medicine | Admitting: Emergency Medicine

## 2022-04-23 ENCOUNTER — Encounter (HOSPITAL_COMMUNITY): Payer: Self-pay

## 2022-04-23 DIAGNOSIS — K5792 Diverticulitis of intestine, part unspecified, without perforation or abscess without bleeding: Secondary | ICD-10-CM | POA: Diagnosis not present

## 2022-04-23 DIAGNOSIS — R1084 Generalized abdominal pain: Secondary | ICD-10-CM | POA: Diagnosis not present

## 2022-04-23 DIAGNOSIS — R109 Unspecified abdominal pain: Secondary | ICD-10-CM | POA: Diagnosis not present

## 2022-04-23 DIAGNOSIS — K5732 Diverticulitis of large intestine without perforation or abscess without bleeding: Secondary | ICD-10-CM | POA: Diagnosis not present

## 2022-04-23 LAB — URINALYSIS, ROUTINE W REFLEX MICROSCOPIC
Bilirubin Urine: NEGATIVE
Glucose, UA: NEGATIVE mg/dL
Ketones, ur: NEGATIVE mg/dL
Leukocytes,Ua: NEGATIVE
Nitrite: NEGATIVE
Protein, ur: NEGATIVE mg/dL
Specific Gravity, Urine: 1.003 — ABNORMAL LOW (ref 1.005–1.030)
pH: 6 (ref 5.0–8.0)

## 2022-04-23 LAB — CBC
HCT: 38.4 % (ref 36.0–46.0)
Hemoglobin: 12.2 g/dL (ref 12.0–15.0)
MCH: 26.1 pg (ref 26.0–34.0)
MCHC: 31.8 g/dL (ref 30.0–36.0)
MCV: 82.2 fL (ref 80.0–100.0)
Platelets: 368 10*3/uL (ref 150–400)
RBC: 4.67 MIL/uL (ref 3.87–5.11)
RDW: 14.9 % (ref 11.5–15.5)
WBC: 12.6 10*3/uL — ABNORMAL HIGH (ref 4.0–10.5)
nRBC: 0 % (ref 0.0–0.2)

## 2022-04-23 LAB — COMPREHENSIVE METABOLIC PANEL
ALT: 16 U/L (ref 0–44)
AST: 20 U/L (ref 15–41)
Albumin: 3.9 g/dL (ref 3.5–5.0)
Alkaline Phosphatase: 88 U/L (ref 38–126)
Anion gap: 10 (ref 5–15)
BUN: 12 mg/dL (ref 6–20)
CO2: 26 mmol/L (ref 22–32)
Calcium: 9.4 mg/dL (ref 8.9–10.3)
Chloride: 102 mmol/L (ref 98–111)
Creatinine, Ser: 0.96 mg/dL (ref 0.44–1.00)
GFR, Estimated: >60 mL/min (ref >60)
Glucose, Bld: 89 mg/dL (ref 70–99)
Potassium: 3.5 mmol/L (ref 3.5–5.1)
Sodium: 138 mmol/L (ref 135–145)
Total Bilirubin: 0.7 mg/dL (ref 0.3–1.2)
Total Protein: 8.3 g/dL — ABNORMAL HIGH (ref 6.5–8.1)

## 2022-04-23 LAB — LIPASE, BLOOD: Lipase: 31 U/L (ref 11–51)

## 2022-04-23 LAB — POC URINE PREG, ED: Preg Test, Ur: NEGATIVE

## 2022-04-23 MED ORDER — CIPROFLOXACIN HCL 250 MG PO TABS
500.0000 mg | ORAL_TABLET | Freq: Once | ORAL | Status: AC
  Administered 2022-04-23: 500 mg via ORAL
  Filled 2022-04-23: qty 2

## 2022-04-23 MED ORDER — ONDANSETRON 4 MG PO TBDP
4.0000 mg | ORAL_TABLET | Freq: Once | ORAL | Status: AC
  Administered 2022-04-23: 4 mg via ORAL
  Filled 2022-04-23: qty 1

## 2022-04-23 MED ORDER — METRONIDAZOLE 500 MG PO TABS: 500.0000 mg | ORAL_TABLET | Freq: Two times a day (BID) | ORAL | 0 refills | Status: AC

## 2022-04-23 MED ORDER — METRONIDAZOLE 500 MG PO TABS
500.0000 mg | ORAL_TABLET | Freq: Once | ORAL | Status: AC
  Administered 2022-04-23: 500 mg via ORAL
  Filled 2022-04-23: qty 1

## 2022-04-23 MED ORDER — CIPROFLOXACIN HCL 500 MG PO TABS: 500.0000 mg | ORAL_TABLET | Freq: Two times a day (BID) | ORAL | 0 refills | Status: AC

## 2022-04-23 MED ORDER — IOHEXOL 300 MG/ML  SOLN
100.0000 mL | Freq: Once | INTRAMUSCULAR | Status: AC | PRN
  Administered 2022-04-23: 100 mL via INTRAVENOUS

## 2022-04-23 MED ORDER — ONDANSETRON 4 MG PO TBDP: 4.0000 mg | ORAL_TABLET | Freq: Three times a day (TID) | ORAL | 0 refills | Status: AC | PRN

## 2022-04-23 NOTE — ED Triage Notes (Signed)
Pt presents to ED with complaints of intermittent lower abdominal pain radiating from left to right side. Hx diverticulitis. Pt states she has had some painful stools

## 2022-04-23 NOTE — Discharge Instructions (Addendum)
Follow up with your Physician for recheck.  Return if any problems.  

## 2022-04-24 NOTE — ED Provider Notes (Signed)
[facility] EMERGENCY DEPARTMENT Provider Note   CSN: [id number] Arrival date & time: 04/23/22  1555     History  Chief Complaint  Patient presents with   Abdominal Pain    Peggy Rogers is a 46 y.o. female.  Pt complains of pain in her lower abdomen.  Pt has a history of diverticulitis.   The history is provided by the patient. No language interpreter was used.  Abdominal Pain Pain location:  Generalized Pain quality: aching   Pain radiates to:  Does not radiate Pain severity:  Moderate Timing:  Constant Progression:  Worsening Chronicity:  New Relieved by:  Nothing Worsened by:  Nothing      Home Medications Prior to Admission medications   Medication Sig Start Date End Date Taking? Authorizing Provider  ciprofloxacin (CIPRO) 500 MG tablet Take 1 tablet (500 mg total) by mouth 2 (two) times daily for 10 days. 04/23/22 05/03/22 Yes Caryl Ada K, PA-C  metroNIDAZOLE (FLAGYL) 500 MG tablet Take 1 tablet (500 mg total) by mouth 2 (two) times daily for 10 days. 04/23/22 05/03/22 Yes Caryl Ada K, PA-C  ondansetron (ZOFRAN-ODT) 4 MG disintegrating tablet Take 1 tablet (4 mg total) by mouth every 8 (eight) hours as needed for nausea or vomiting. 04/23/22  Yes Fransico Meadow, PA-C  Cholecalciferol 1.25 MG (50000 UT) capsule Take 1 capsule (50,000 Units total) by mouth once a week. 06/17/21   McLean-Scocuzza, Nino Glow, MD  Probiotic Product (PROBIOTIC DAILY PO) Take by mouth. Patient not taking: Reported on 12/20/2019    [provider]      Allergies    Penicillins    Review of Systems   Review of Systems  Gastrointestinal:  Positive for abdominal pain.  All other systems reviewed and are negative.   Physical Exam Updated Vital Signs BP (!) 161/99   Pulse 91   Temp 99 F (37.2 C) (Oral)   Resp 18   Ht 5\' 3"  (1.6 m)   Wt 112.9 kg   LMP 04/20/2022   SpO2 100%   BMI 44.11 kg/m  Physical Exam Vitals and nursing note reviewed.   Constitutional:      Appearance: She is well-developed.  HENT:     Head: Normocephalic.  Cardiovascular:     Rate and Rhythm: Normal rate and regular rhythm.  Pulmonary:     Effort: Pulmonary effort is normal.  Abdominal:     General: Abdomen is flat. Bowel sounds are normal. There is no distension.     Palpations: Abdomen is soft.     Tenderness: There is abdominal tenderness in the left lower quadrant.  Musculoskeletal:        General: Normal range of motion.     Cervical back: Normal range of motion.  Skin:    General: Skin is warm.  Neurological:     Mental Status: She is alert and oriented to person, place, and time.     ED Results / Procedures / Treatments   Labs (all labs ordered are listed, but only abnormal results are displayed) Labs Reviewed  COMPREHENSIVE METABOLIC PANEL - Abnormal; Notable for the following components:      Result Value   Total Protein 8.3 (*)    All other components within normal limits  CBC - Abnormal; Notable for the following components:   WBC 12.6 (*)    All other components within normal limits  URINALYSIS, ROUTINE W REFLEX MICROSCOPIC - Abnormal; Notable for the following components:   Color, Urine  STRAW (*)    Specific Gravity, Urine 1.003 (*)    Hgb urine dipstick MODERATE (*)    Bacteria, UA RARE (*)    All other components within normal limits  LIPASE, BLOOD  POC URINE PREG, ED    EKG None  Radiology CT ABDOMEN PELVIS W CONTRAST  Result Date: 04/23/2022 CLINICAL DATA:  Lower abdomen pain EXAM: CT ABDOMEN AND PELVIS WITH CONTRAST TECHNIQUE: Multidetector CT imaging of the abdomen and pelvis was performed using the standard protocol following bolus administration of intravenous contrast. RADIATION DOSE REDUCTION: This exam was performed according to the departmental dose-optimization program which includes automated exposure control, adjustment of the mA and/or kV according to patient size and/or use of iterative  reconstruction technique. CONTRAST:  OMNIPAQUE IOHEXOL 300 MG/ML  SOLN COMPARISON:  CT 07/20/2017 FINDINGS: Lower chest: No acute abnormality. Hepatobiliary: No focal liver abnormality is seen. No gallstones, gallbladder wall thickening, or biliary dilatation. Pancreas: Unremarkable. No pancreatic ductal dilatation or surrounding inflammatory changes. Spleen: Normal in size without focal abnormality. Adrenals/Urinary Tract: Adrenal glands are unremarkable. Kidneys are normal, without renal calculi, focal lesion, or hydronephrosis. Bladder is unremarkable. Stomach/Bowel: The stomach is nonenlarged. No dilated small bowel. Negative appendix. Diverticular disease of the left colon. Wall thickening and moderate inflammation at the sigmoid colon consistent with diverticulitis. No convincing perforation or abscess. Vascular/Lymphatic: No significant vascular findings are present. No enlarged abdominal or pelvic lymph nodes. Reproductive: Uterus and bilateral adnexa are unremarkable. Other: No free air or free fluid Musculoskeletal: No acute or significant osseous findings. IMPRESSION: 1. Findings consistent with acute sigmoid colon diverticulitis. No definite perforation or abscess. Electronically Signed   By: Jasmine Pang M.D.   On: 04/23/2022 20:40    Procedures Procedures    Medications Ordered in ED Medications  iohexol (OMNIPAQUE) 300 MG/ML solution 100 mL (100 mLs Intravenous Contrast Given 04/23/22 2030)  ciprofloxacin (CIPRO) tablet 500 mg (500 mg Oral Given 04/23/22 2126)  metroNIDAZOLE (FLAGYL) tablet 500 mg (500 mg Oral Given 04/23/22 2126)  ondansetron (ZOFRAN-ODT) disintegrating tablet 4 mg (4 mg Oral Given 04/23/22 2126)    ED Course/ Medical Decision Making/ A&P                           Medical Decision Making Pt complains of left lower abdominal pain.  Pt has had diverticulitis in the past.  Pt reports pain feels similiar   Amount and/or Complexity of Data Reviewed Labs:  ordered. Decision-making details documented in ED Course.    Details: Labs ordered reviewed and interpreted.  Patient has a normal white blood cell count urine is negative Radiology: ordered.    Details: CT scan shows acute  Risk Prescription drug management. Risk Details: Patient counseled on results patient advised of diverticulitis she is given Cipro 500 mg here and Flagyl 500 mg here patient is given a prescription for Cipro Flagyl and Zofran she is advised to follow-up with her physician for recheck she is to return to the emergency department for any problems  Lloyd: Diverticulitis.  Patient has a normal white blood cell count urine is negative         Final Clinical Impression(s) / ED Diagnoses Final diagnoses:  Diverticulitis    Rx / DC Orders ED Discharge Orders          Ordered    ciprofloxacin (CIPRO) 500 MG tablet  2 times daily        04/23/22 2111  metroNIDAZOLE (FLAGYL) 500 MG tablet  2 times daily        04/23/22 2111    ondansetron (ZOFRAN-ODT) 4 MG disintegrating tablet  Every 8 hours PRN        04/23/22 2120           An After Visit Summary was printed and given to the patient.    Elson Areas, PA-C 04/25/22 1920    Vanetta Mulders, MD 04/26/22 [phone]

## 2022-04-28 ENCOUNTER — Telehealth: Payer: Self-pay

## 2022-04-28 NOTE — Telephone Encounter (Signed)
Called patient no answer left voice to call the office back to schedule appt after 06/19/22 for Annual per Tish. ep

## 2022-06-20 ENCOUNTER — Other Ambulatory Visit: Payer: BC Managed Care – PPO

## 2022-06-24 ENCOUNTER — Encounter: Payer: BC Managed Care – PPO | Admitting: Internal Medicine

## 2022-06-24 ENCOUNTER — Telehealth: Payer: Self-pay | Admitting: Internal Medicine

## 2022-06-24 NOTE — Telephone Encounter (Signed)
Pt of Dr [employer].  Received notification that she is overdue mammogram. Please call for her to schedule.  She gets her mammograms through [facility].

## 2022-06-24 NOTE — Telephone Encounter (Signed)
LMOM to CB to give phone number to [facility] to call and schedule mammo: [phone]

## 2022-06-25 NOTE — Telephone Encounter (Signed)
Pt has been notified through My chart 

## 2022-11-17 DIAGNOSIS — G4489 Other headache syndrome: Secondary | ICD-10-CM | POA: Diagnosis not present

## 2022-11-17 DIAGNOSIS — Z6841 Body Mass Index (BMI) 40.0 and over, adult: Secondary | ICD-10-CM | POA: Diagnosis not present

## 2022-11-17 DIAGNOSIS — R030 Elevated blood-pressure reading, without diagnosis of hypertension: Secondary | ICD-10-CM | POA: Diagnosis not present

## 2022-11-19 DIAGNOSIS — Z130 Encounter for screening for diseases of the blood and blood-forming organs and certain disorders involving the immune mechanism: Secondary | ICD-10-CM | POA: Diagnosis not present

## 2022-11-19 DIAGNOSIS — Z1330 Encounter for screening examination for mental health and behavioral disorders, unspecified: Secondary | ICD-10-CM | POA: Diagnosis not present

## 2022-11-19 DIAGNOSIS — E559 Vitamin D deficiency, unspecified: Secondary | ICD-10-CM | POA: Diagnosis not present

## 2022-11-19 DIAGNOSIS — R7303 Prediabetes: Secondary | ICD-10-CM | POA: Diagnosis not present

## 2022-11-19 DIAGNOSIS — E782 Mixed hyperlipidemia: Secondary | ICD-10-CM | POA: Diagnosis not present

## 2022-11-19 DIAGNOSIS — Z0000 Encounter for general adult medical examination without abnormal findings: Secondary | ICD-10-CM | POA: Diagnosis not present

## 2023-01-12 DIAGNOSIS — R92323 Mammographic fibroglandular density, bilateral breasts: Secondary | ICD-10-CM | POA: Diagnosis not present

## 2023-01-12 DIAGNOSIS — M25562 Pain in left knee: Secondary | ICD-10-CM | POA: Diagnosis not present

## 2023-01-12 DIAGNOSIS — Z1231 Encounter for screening mammogram for malignant neoplasm of breast: Secondary | ICD-10-CM | POA: Diagnosis not present

## 2023-01-12 DIAGNOSIS — Z5941 Food insecurity: Secondary | ICD-10-CM | POA: Diagnosis not present

## 2023-01-12 DIAGNOSIS — R030 Elevated blood-pressure reading, without diagnosis of hypertension: Secondary | ICD-10-CM | POA: Diagnosis not present

## 2023-01-12 DIAGNOSIS — M25561 Pain in right knee: Secondary | ICD-10-CM | POA: Diagnosis not present

## 2023-01-26 DIAGNOSIS — I10 Essential (primary) hypertension: Secondary | ICD-10-CM | POA: Diagnosis not present

## 2023-01-26 DIAGNOSIS — F419 Anxiety disorder, unspecified: Secondary | ICD-10-CM | POA: Diagnosis not present

## 2023-01-28 DIAGNOSIS — R92321 Mammographic fibroglandular density, right breast: Secondary | ICD-10-CM | POA: Diagnosis not present

## 2023-01-28 DIAGNOSIS — R922 Inconclusive mammogram: Secondary | ICD-10-CM | POA: Diagnosis not present

## 2023-01-28 DIAGNOSIS — R928 Other abnormal and inconclusive findings on diagnostic imaging of breast: Secondary | ICD-10-CM | POA: Diagnosis not present

## 2023-02-18 DIAGNOSIS — L814 Other melanin hyperpigmentation: Secondary | ICD-10-CM | POA: Diagnosis not present

## 2023-02-18 DIAGNOSIS — D2362 Other benign neoplasm of skin of left upper limb, including shoulder: Secondary | ICD-10-CM | POA: Diagnosis not present

## 2023-02-18 DIAGNOSIS — L821 Other seborrheic keratosis: Secondary | ICD-10-CM | POA: Diagnosis not present

## 2023-02-18 DIAGNOSIS — B001 Herpesviral vesicular dermatitis: Secondary | ICD-10-CM | POA: Diagnosis not present

## 2023-03-02 DIAGNOSIS — Z7182 Exercise counseling: Secondary | ICD-10-CM | POA: Diagnosis not present

## 2023-03-02 DIAGNOSIS — E668 Other obesity: Secondary | ICD-10-CM | POA: Diagnosis not present

## 2023-03-02 DIAGNOSIS — Z713 Dietary counseling and surveillance: Secondary | ICD-10-CM | POA: Diagnosis not present

## 2023-03-02 DIAGNOSIS — Z6841 Body Mass Index (BMI) 40.0 and over, adult: Secondary | ICD-10-CM | POA: Diagnosis not present

## 2023-03-03 DIAGNOSIS — F331 Major depressive disorder, recurrent, moderate: Secondary | ICD-10-CM | POA: Diagnosis not present

## 2023-03-03 DIAGNOSIS — I10 Essential (primary) hypertension: Secondary | ICD-10-CM | POA: Diagnosis not present

## 2023-03-03 DIAGNOSIS — Z79899 Other long term (current) drug therapy: Secondary | ICD-10-CM | POA: Diagnosis not present

## 2023-03-11 DIAGNOSIS — F411 Generalized anxiety disorder: Secondary | ICD-10-CM | POA: Diagnosis not present

## 2023-03-11 DIAGNOSIS — F331 Major depressive disorder, recurrent, moderate: Secondary | ICD-10-CM | POA: Diagnosis not present

## 2023-03-20 DIAGNOSIS — B3731 Acute candidiasis of vulva and vagina: Secondary | ICD-10-CM | POA: Diagnosis not present

## 2023-03-20 DIAGNOSIS — R110 Nausea: Secondary | ICD-10-CM | POA: Diagnosis not present

## 2023-03-20 DIAGNOSIS — K5792 Diverticulitis of intestine, part unspecified, without perforation or abscess without bleeding: Secondary | ICD-10-CM | POA: Diagnosis not present

## 2023-03-20 NOTE — Progress Notes (Signed)
 [facility] [address] [place], [place] [address] Ph: [phone] Fax: [phone]    Acute Visit   Chief Complaint  Patient presents with  . Diverticulitis    Flare up since monday  . Abdominal Pain    llQ  . Bloated     Assessment / Plan:   [name] was seen today for diverticulitis, abdominal pain and bloated.  Diagnoses and all orders for this visit:  Nausea -     ondansetron  (ZOFRAN -ODT) 8 mg disintegrating tablet; Take one tablet (8 mg dose) by mouth every 8 (eight) hours as needed for up to 7 days.  Diverticulitis -     metroNIDAZOLE  (FLAGYL ) 500 mg tablet; Take one tablet (500 mg dose) by mouth 3 (three) times a day for 10 days. -     ciprofloxacin  (CIPRO ) 500 mg tablet; Take one tablet (500 mg dose) by mouth 2 (two) times daily for 10 days.  Vaginal candidiasis -     fluconazole  (DIFLUCAN ) 150 mg tablet; Take one tablet (150 mg dose) by mouth once for 1 dose. Take one tablet by mouth as a single dose.      Reassurance, risks, benefits, and alternatives of the medications and treatment plan prescribed today were discussed, and patient expressed understanding. Plan follow-up as discussed or as needed if any worsening symptoms or change in condition.    Face to face interview, assessment, counseling and chart review preformed this visit.      [name] Medications       * Accurate as of March 20, 2023  1:51 PM. Reflects encounter med changes as of last refresh          New Prescriptions      Instructions  ciprofloxacin  500 mg tablet Commonly known as: CIPRO  Started by: Kyra McClanahan, NP  500 mg, Oral, 2 times a day   fluconazole  150 mg tablet Commonly known as: DIFLUCAN  Started by: Kyra McClanahan, NP  150 mg, Oral, Once, Take one tablet by mouth as a single dose.   metroNIDAZOLE  500 mg tablet Commonly known as: FLAGYL  Started by: Kyra McClanahan, NP  500 mg, Oral, 3 times a day   ondansetron  8 mg  disintegrating tablet Commonly known as: ZOFRAN -ODT Started by: Kyra McClanahan, NP  8 mg, Oral, Every 8 hours as needed       Continued Medications      Instructions  amLODIPine besylate 10 mg tablet Commonly known as: NORVASC  10 mg, Oral, Daily   buPROPion HCl 150 mg 24 hr tablet Commonly known as: WELLBUTRIN XL  150 mg, Oral, Every morning       Discontinued Medications    valacyclovir 1000 mg tablet Commonly known as: VALTREX Stopped by: Efrain Broaden, NP        No orders of the defined types were placed in this encounter.      Subjective:   Patient ID:  Peggy Rogers is a 47 y.o. (DOB [birth year]) female.     Patient presents with  . Diverticulitis    Flare up since monday  . Abdominal Pain    llQ  . Bloated      HPI:  Report that she noticed a change 7 days ago.  She has been having a change in her bowel pattern, twice a day.  Reports that she was constipated, but then has diarrhea. She changed her diet to liquids.  She is having abdominal cramping, bloated. She is having severe sympus that are intermittent in nature.  She took a single acetaminophen to help with her sleep a few days ago and feels like has started this.  Has diverticulitis and last flare was over a  year ago.  Last Colonoscopy 2019, next 2029  Past Medical History, Past Surgery History, Allergies, Social History, and Family History were reviewed and updated.    Review of Systems  Constitutional: Negative.   HENT: Negative.    Respiratory: Negative.    Cardiovascular: Negative.   Gastrointestinal:  Positive for abdominal distention, abdominal pain, constipation, diarrhea and nausea. Negative for blood in stool and vomiting.  Genitourinary: Negative.   Musculoskeletal: Negative.     Negative except noted above.   Objective:    BP 124/84 (BP Location: Left arm, Patient Position: Sitting)   Pulse 78   Temp 98 F (36.7 C) (Oral)   Resp 14   Ht 5' 2.5 (1.588 m)   Wt  257 lb 3.2 oz (116.7 kg)   LMP 03/13/2023 (Approximate)   SpO2 98%   BMI 46.29 kg/m   Physical Exam Vitals reviewed.  Constitutional:      Appearance: Normal appearance.  HENT:     Head: Normocephalic.     Mouth/Throat:     Mouth: Mucous membranes are moist.  Eyes:     Pupils: Pupils are equal, round, and reactive to light.  Cardiovascular:     Rate and Rhythm: Normal rate and regular rhythm.     Pulses: Normal pulses.     Heart sounds: Normal heart sounds. No murmur heard.    No gallop.  Pulmonary:     Effort: Pulmonary effort is normal.     Breath sounds: Normal breath sounds.  Abdominal:     General: There is no distension.     Palpations: Abdomen is soft.     Tenderness: There is abdominal tenderness in the left lower quadrant. There is no right CVA tenderness, left CVA tenderness, guarding or rebound.  Skin:    General: Skin is warm.     Capillary Refill: Capillary refill takes less than 2 seconds.  Neurological:     General: No focal deficit present.     Mental Status: She is alert and oriented to person, place, and time.  Psychiatric:        Mood and Affect: Mood normal.        Behavior: Behavior normal.        Thought Content: Thought content normal.        Judgment: Judgment normal.            Efrain Broaden, NP 03/20/2023, 1:51 PM  *Some images could not be shown.

## 2023-08-31 NOTE — Progress Notes (Signed)
 Subjective:    Peggy Rogers is a 48 y.o. (DOB [birth year]) female.     Patient presents with  . skin issues    Last summer 2024. Had a spot on chest. Wasn't bothersome. Went to dermatology and had it checked out. Was told to not worry about it if it wasn't bothering her. The last couple weeks, not sure if nerves or if she got into something. Now has something on the rt side of her face and around her neck was red. Mild itching. No the spot on her chest got very tender and red, warm to touch.      HPI  Patient is a 48 year old female who presents reporting skin issues.   Bump on Chest  Pt presents with bump on her chest that within the past week has doubled in size and became very tender and red. She tried warm compresses and Bendadryl, which provided no sxs relief. She also tried cold compresses, which provided mild temporary sxs relief. Pt states the pain has improved significantly since last week, but is still moderately tender. Pt states this bump initially appeared last summer and it was approx the size of an eraser. Saw dermatology around the time it appeared and they did a whole body scan and determined the bump was benign. No biopsy conducted at that time.   Rash on Face Pt states that 2 weeks ago she woke up with a rash on the R side of face and down her neck after using a new face wash. Rash described as erythematous, and initially burning and pruritic. Rash still present, but dec in size and no longer burns or itches. Has been using shea butter lotion and coconut oil which has improved sxs. No new detergents, body washes, or shampoos/conditioners. Denies recent URI, but states that she has been experiencing intermittent congestion and bloody nasal secretions for the past month. No new pets in the home, but has a cat and is allergic to cats. Pt expressed concern that the rash and upper respiratory sxs could be d/2 mold in the house. Pt states that her roof has had a leak in it for a  while, landlord has not fixed leak nor done anything about moisture in ceiling.    Reviewed and updated this visit by provider: Tobacco  Allergies  Meds  Problems  Med Hx  Surg Hx  Fam Hx       Review of Systems  Constitutional: Negative.   HENT:  Positive for congestion. Negative for ear pain, postnasal drip, rhinorrhea, sneezing and sore throat.        Endorses blood tinged snot  Eyes:  Negative for discharge and itching.  Respiratory: Negative.  Negative for cough and shortness of breath.   Cardiovascular: Negative.  Negative for chest pain.  Gastrointestinal: Negative.   Musculoskeletal:  Negative for arthralgias and myalgias.  Skin:  Positive for rash.       Endorses bump on chest      Objective:   Vitals:   08/31/23 1553  BP: 118/88  Patient Position: Sitting  Pulse: 92  Temp: 97.3 F (36.3 C)  TempSrc: Skin  Resp: 18  Height: 5' 2.5 (1.588 m)  Weight: 256 lb (116.1 kg)  SpO2: 98%  BMI (Calculated): 46  PainSc: 0-No pain   Physical Exam Vitals and nursing note reviewed.  Constitutional:      General: She is not in acute distress.    Appearance: Normal appearance.  HENT:  Head: Normocephalic and atraumatic.     Right Ear: Tympanic membrane, ear canal and external ear normal. There is no impacted cerumen.     Left Ear: Tympanic membrane, ear canal and external ear normal. There is no impacted cerumen.     Nose: Nose normal.     Mouth/Throat:     Mouth: Mucous membranes are moist.     Pharynx: Oropharynx is clear.  Eyes:     Extraocular Movements: Extraocular movements intact.     Conjunctiva/sclera: Conjunctivae normal.  Cardiovascular:     Rate and Rhythm: Normal rate and regular rhythm.     Heart sounds: Normal heart sounds.  Musculoskeletal:        General: Normal range of motion.     Cervical back: Normal range of motion.  Pulmonary:     Effort: Pulmonary effort is normal.     Breath sounds: Normal breath sounds.  Skin:    General:  Skin is warm and dry.     Comments: Erythematous, soft, grape sized mass on upper sternum. No d/c, overlying warmth, or cellulitis. No palpable fluctuance.    Neurological:     General: No focal deficit present.     Mental Status: She is alert and oriented to person, place, and time. Mental status is at baseline.  Psychiatric:        Mood and Affect: Mood normal.        Behavior: Behavior normal.        Thought Content: Thought content normal.        Judgment: Judgment normal.         Assessment / Plan:   Assessment 1. Mass of sternum (Primary) -     doxycycline (ADOXA) 100 MG tablet; Take one tablet (100 mg dose) by mouth 2 (two) times daily for 7 days., Starting Mon 08/31/2023, Until Mon 09/07/2023, Normal 2. Rash of face 3. Allergic rhinitis, unspecified seasonality, unspecified trigger -     levocetirizine (XYZAL) 5 MG tablet; Take one tablet (5 mg dose) by mouth every evening., Starting Mon 08/31/2023, Normal    Plan 1-2.  Rx Doxycycline 100 mg PO BID x 7 days sent to pharmacy.  Warm compresses. Tylenol / Motrin prn for discomfort. Avoid harsh soaps / cleansers.  F/u in 1 week.   3.  Rx Xyxal 5 mg PO QD for allergies.   Risks, benefits, and alternatives of the medications and treatment plan prescribed today were discussed, and patient expressed understanding. Plan follow-up as discussed or as needed if any worsening symptoms or change in condition.

## 2023-09-07 NOTE — Progress Notes (Signed)
 Patient ID:  Peggy Rogers is a 48 y.o. (DOB 1975-10-26) female.  Assessment and Plan   1. Mass of sternum   2. Sebaceous cyst   3. Allergic rhinitis, unspecified seasonality, unspecified trigger     Assessment & Plan 1-2. Suspected sebaceous cyst Call [facility] for evaluation and potential excision, pt already est there. Diagnostic plan: [facility] for evaluation and potential excision. Treatment plan: Complete current antibiotics, 3 doses remaining. Apply OTC topical triple antibiotic therapy (e.g., Neosporin). No evidence for I&D today.  3. Adverse reaction to Xyzal Experienced significant drowsiness and disorientation. Treatment plan: Advise cutting dose to 2.5 mg and monitor response.   Results    Patient understands and agrees with plan. Computer technology was used to create visit note. Consent from the patient/caregiver was obtained prior to use.  No follow-ups on file.    [name] Medications       * Accurate as of September 07, 2023  4:54 PM. Reflects encounter med changes as of last refresh          Continued Medications      Instructions  amLODIPine besylate 10 mg tablet Commonly known as: NORVASC  10 mg, Oral, Daily   buPROPion HCl 150 mg 24 hr tablet Commonly known as: WELLBUTRIN XL  150 mg, Oral, Every morning   doxycycline 100 MG tablet Commonly known as: ADOXA  100 mg, Oral, 2 times a day   levocetirizine 5 MG tablet Commonly known as: XYZAL  5 mg, Oral, Every evening        No orders of the defined types were placed in this encounter.   Risks, benefits, and alternatives of the medications and treatment plan prescribed today were discussed, and patient expressed understanding. Plan follow-up as discussed or as needed if any worsening symptoms or change in condition.    A yearly preventative health exam was recommended and current age based recommendations were discussed.   Subjective   Patient ID:  Peggy Rogers is a  48 y.o. (DOB 1975/08/26) female    Patient presents with  . f/up    Spot on chest. Warm and tender more on one side.      HPI:   History of Present Illness 48 year old female presents for follow-up.  Reports persistent discomfort from skin bump, redness localized to one side with pain upon manipulation. Adhering to antibiotics, one day late in starting, 3 doses remaining. No topical antibiotics prescribed. Previous body scan at [facility] identified a non-problematic small, raised bump. Routine follow-up with [facility] in 02/2024.  Took Xyzal at 9:30 PM, resulting in excessive sleepiness, oversleeping, and daytime fatigue. Unable to take Benadryl s/2 SE.  MEDICATIONS Current: Xyzal, doxycycline    Past Medical History, Past Surgery History, Allergies, Social History, and Family History were reviewed and updated.    Review of Systems  Constitutional: Negative.   HENT: Negative.    Respiratory: Negative.    Cardiovascular: Negative.   Gastrointestinal: Negative.   Musculoskeletal: Negative.   Skin:        Endorses lump on chest wall  Neurological: Negative.   All other systems reviewed and are negative.    Objective   Vitals:   09/07/23 1517  BP: 138/90  Patient Position: Sitting  Pulse: 79  Temp: 97.2 F (36.2 C)  TempSrc: Skin  Resp: 18  Height: 5' 2.5 (1.588 m)  Weight: 258 lb 3.2 oz (117.1 kg)  SpO2: 97%  BMI (Calculated): 46.4  PainSc: 0-No pain  Physical Exam Vitals and nursing note reviewed.  Constitutional:      General: She is not in acute distress.    Appearance: Normal appearance.  HENT:     Head: Normocephalic and atraumatic.     Nose: Nose normal.  Eyes:     Extraocular Movements: Extraocular movements intact.     Pupils: Pupils are equal, round, and reactive to light.  Cardiovascular:     Rate and Rhythm: Regular rhythm.  Musculoskeletal:        General: Normal range of motion.     Cervical back: Normal range of  motion.  Pulmonary:     Effort: Pulmonary effort is normal.  Skin:    General: Skin is warm and dry.     Comments: Soft, non mobile mass on chest wall with erythematous boarder, no warmth to palpation, mild TTP  Neurological:     General: No focal deficit present.     Mental Status: She is alert and oriented to person, place, and time.        Voice recognition software was used and creation of this note. Despite my best effort at editing the text, some misspelling/errors may have occurred. *Some images could not be shown.

## 2024-01-07 ENCOUNTER — Encounter (HOSPITAL_COMMUNITY): Payer: Self-pay

## 2024-01-07 ENCOUNTER — Emergency Department (HOSPITAL_COMMUNITY)
Admission: EM | Admit: 2024-01-07 | Discharge: 2024-01-07 | Disposition: A | Discharge status: Home / Self Care | Attending: Emergency Medicine | Admitting: Emergency Medicine

## 2024-01-07 ENCOUNTER — Emergency Department (HOSPITAL_COMMUNITY)

## 2024-01-07 ENCOUNTER — Other Ambulatory Visit: Payer: Self-pay

## 2024-01-07 DIAGNOSIS — Z79899 Other long term (current) drug therapy: Secondary | ICD-10-CM | POA: Diagnosis not present

## 2024-01-07 DIAGNOSIS — I10 Essential (primary) hypertension: Secondary | ICD-10-CM | POA: Diagnosis not present

## 2024-01-07 DIAGNOSIS — R0602 Shortness of breath: Secondary | ICD-10-CM | POA: Insufficient documentation

## 2024-01-07 DIAGNOSIS — R600 Localized edema: Secondary | ICD-10-CM | POA: Diagnosis present

## 2024-01-07 LAB — CBC WITH DIFFERENTIAL/PLATELET
Abs Immature Granulocytes: 0.03 10*3/uL (ref 0.00–0.07)
Basophils Absolute: 0.1 10*3/uL (ref 0.0–0.1)
Basophils Relative: 1 %
Eosinophils Absolute: 0.2 10*3/uL (ref 0.0–0.5)
Eosinophils Relative: 2 %
HCT: 37.9 % (ref 36.0–46.0)
Hemoglobin: 11.8 g/dL — ABNORMAL LOW (ref 12.0–15.0)
Immature Granulocytes: 0 %
Lymphocytes Relative: 23 %
Lymphs Abs: 1.9 10*3/uL (ref 0.7–4.0)
MCH: 26.2 pg (ref 26.0–34.0)
MCHC: 31.1 g/dL (ref 30.0–36.0)
MCV: 84 fL (ref 80.0–100.0)
Monocytes Absolute: 0.7 10*3/uL (ref 0.1–1.0)
Monocytes Relative: 8 %
Neutro Abs: 5.4 10*3/uL (ref 1.7–7.7)
Neutrophils Relative %: 66 %
Platelets: 362 10*3/uL (ref 150–400)
RBC: 4.51 MIL/uL (ref 3.87–5.11)
RDW: 14.5 % (ref 11.5–15.5)
WBC: 8.2 10*3/uL (ref 4.0–10.5)
nRBC: 0 % (ref 0.0–0.2)

## 2024-01-07 LAB — BASIC METABOLIC PANEL WITH GFR
Anion gap: 11 (ref 5–15)
BUN: 9 mg/dL (ref 6–20)
CO2: 25 mmol/L (ref 22–32)
Calcium: 8.7 mg/dL — ABNORMAL LOW (ref 8.9–10.3)
Chloride: 100 mmol/L (ref 98–111)
Creatinine, Ser: 0.9 mg/dL (ref 0.44–1.00)
GFR, Estimated: >60 mL/min (ref >60)
Glucose, Bld: 86 mg/dL (ref 70–99)
Potassium: 3.4 mmol/L — ABNORMAL LOW (ref 3.5–5.1)
Sodium: 136 mmol/L (ref 135–145)

## 2024-01-07 MED ORDER — FUROSEMIDE 20 MG PO TABS: 20.0000 mg | ORAL_TABLET | Freq: Every day | ORAL | 0 refills | Status: AC | PRN

## 2024-01-07 MED ORDER — POTASSIUM CHLORIDE CRYS ER 20 MEQ PO TBCR
40.0000 meq | EXTENDED_RELEASE_TABLET | Freq: Once | ORAL | Status: AC
  Administered 2024-01-07: 40 meq via ORAL
  Filled 2024-01-07: qty 2

## 2024-01-07 NOTE — ED Triage Notes (Signed)
 Pt arrived via POV c/o bilateral lower extremity swelling that PT first noticed this past October. Pt reports swelling worsens when she is sitting at her desk. Pt reports trying to elevate her feet and wear compression socks w/o relief.

## 2024-01-07 NOTE — Discharge Instructions (Addendum)
 I recommend continuing to use your compression stockings, you may use the Lasix 1 tablet daily if needed for increased peripheral edema, you do not need this medication daily and last your legs are particularly swollen.  Continue with elevation as you are doing.  I do recommend following up with your primary doctor for any persistent or worsening symptoms.

## 2024-01-07 NOTE — ED Provider Notes (Signed)
 [place] EMERGENCY DEPARTMENT AT [facility] Provider Note   CSN: [id number] Arrival date & time: 01/07/24  1509     Patient presents with: Leg Swelling   Peggy Rogers is a 48 y.o. female with a history including hypertension, hyperlipidemia, GERD presenting with complaint of increased bilateral lower extremity edema.  She states she has had intermittent problems with swelling, mostly in her ankles when she sits at her desk at work.  She wears compression stockings at work and tries to elevate her feet is much as possible, yesterday her swelling became significantly worse than normal.  She does endorse increased salt intake the day prior however.  She elevated her feet last night hoping the swelling would improve but it has not.  She endorses drinking plenty of fluids, predominantly water.  She denies chest pain, shortness of breath with exertion although has noted some shortness of breath at times when she lies flat.  She has had no cough, denies history of CHF.  Her blood pressures have been under good control upon review of patient's chart.   The history is provided by the patient.       Prior to Admission medications   Medication Sig Start Date End Date Taking? Authorizing Provider  amLODipine (NORVASC) 10 MG tablet Take 10 mg by mouth daily. 12/28/23  Yes [provider]  buPROPion (WELLBUTRIN XL) 150 MG 24 hr tablet Take 150 mg by mouth. 10/28/23  Yes [provider]  furosemide  (LASIX ) 20 MG tablet Take 1 tablet (20 mg total) by mouth daily as needed for edema. 01/07/24  Yes Junius Faucett, PA-C  Cholecalciferol  1.25 MG (50000 UT) capsule Take 1 capsule (50,000 Units total) by mouth once a week. 06/17/21   McLean-Scocuzza, Randine SAILOR, MD  ondansetron  (ZOFRAN -ODT) 4 MG disintegrating tablet Take 1 tablet (4 mg total) by mouth every 8 (eight) hours as needed for nausea or vomiting. 04/23/22   Sofia, Leslie K, PA-C  Probiotic Product (PROBIOTIC DAILY PO) Take by  mouth. Patient not taking: Reported on 12/20/2019    [provider]    Allergies: Penicillins    Review of Systems  Constitutional:  Negative for chills and fever.  HENT: Negative.    Eyes: Negative.   Respiratory:  Negative for chest tightness and shortness of breath.   Cardiovascular:  Positive for leg swelling. Negative for chest pain.  Gastrointestinal:  Negative for abdominal pain and nausea.  Genitourinary: Negative.   Musculoskeletal:  Negative for arthralgias, joint swelling and neck pain.  Skin: Negative.  Negative for rash and wound.  Neurological:  Negative for dizziness, weakness, light-headedness, numbness and headaches.  Psychiatric/Behavioral: Negative.      Updated Vital Signs BP 125/84   Pulse 76   Temp 98.2 F (36.8 C) (Tympanic)   Resp 18   Ht 5' 3 (1.6 m)   Wt 113 kg   SpO2 98%   BMI 44.13 kg/m   Physical Exam Vitals and nursing note reviewed.  Constitutional:      Appearance: She is well-developed.  HENT:     Head: Normocephalic and atraumatic.  Eyes:     Conjunctiva/sclera: Conjunctivae normal.  Cardiovascular:     Rate and Rhythm: Normal rate and regular rhythm.     Heart sounds: Normal heart sounds.  Pulmonary:     Effort: Pulmonary effort is normal.     Breath sounds: Normal breath sounds. No wheezing or rales.  Chest:       Comments: Well-healing surgical  incision from recent sebaceous cyst excision Abdominal:     General: Bowel sounds are normal.     Palpations: Abdomen is soft.     Tenderness: There is no abdominal tenderness.  Musculoskeletal:        General: Normal range of motion.     Cervical back: Normal range of motion.     Right lower leg: Edema present.     Left lower leg: Edema present.     Comments: Moderate nonpitting edema to bilateral ankles feet and toes.  No calf pain or tenderness.  Dorsalis pedal pulses are intact bilaterally.  Skin:    General: Skin is warm and dry.  Neurological:     Mental Status:  She is alert.     (all labs ordered are listed, but only abnormal results are displayed) Labs Reviewed  CBC WITH DIFFERENTIAL/PLATELET - Abnormal; Notable for the following components:      Result Value   Hemoglobin 11.8 (*)    All other components within normal limits  BASIC METABOLIC PANEL WITH GFR - Abnormal; Notable for the following components:   Potassium 3.4 (*)    Calcium 8.7 (*)    All other components within normal limits    EKG: None  Radiology: DG Chest Portable 1 View Result Date: 01/07/2024 CLINICAL DATA:  sob and bilateral lower extremity swelling EXAM: PORTABLE CHEST - 1 VIEW COMPARISON:  None available. FINDINGS: No focal airspace consolidation, pleural effusion, or pneumothorax. Borderline cardiomegaly.No acute fracture or destructive lesion. Multilevel thoracic osteophytosis. IMPRESSION: No acute cardiopulmonary abnormality. Electronically Signed   By: Rogelia Myers M.D.   On: 01/07/2024 18:15     Procedures   Medications Ordered in the ED  potassium chloride  SA (KLOR-CON  M) CR tablet 40 mEq (has no administration in time range)                                    Medical Decision Making Patient presenting with chronic lower extremity edema worse since yesterday.  Her exam is reassuring, she has bilateral edema without tenderness or erythema, no signs or symptoms suggesting DVT, especially since is the bilateral process, no cellulitis.  She does not have a history of CHF, she has endorsed some vague shortness of breath when supine recently but has no rales on exam and her chest x-ray is unremarkable.  We did discuss monitoring her salt intake as she endorses increase salt consumption the day prior to onset of worsening edema.  She is given a small prescription for as needed Lasix , discussed foods that have high potassium content as her potassium today is 3.4.  She was given a oral supplement of potassium here.  Amount and/or Complexity of Data Reviewed Labs:  ordered.    Details: Labs reviewed including a CBC and a B met, normal except for a potassium of 3.4.  Her hemoglobin is borderline low at 11.8, similar to prior recent hemoglobins. Radiology: ordered.    Details: Chest x-ray reviewed, negative.  Risk Prescription drug management.        Final diagnoses:  Peripheral edema    ED Discharge Orders          Ordered    furosemide  (LASIX ) 20 MG tablet  Daily PRN        01/07/24 1848               Cheral Cappucci, PA-C 01/07/24 1904    [place],  Ozell BROCKS, MD 01/08/24 1056
